# Patient Record
Sex: Female | Born: 1935 | Race: White | Hispanic: No | Marital: Married | Smoking: Never smoker
Health system: Southern US, Community
[De-identification: ages and names within clinical notes are randomized; demographics above are authoritative.]

## PROBLEM LIST (undated history)

## (undated) DIAGNOSIS — Z972 Presence of dental prosthetic device (complete) (partial): Secondary | ICD-10-CM

## (undated) DIAGNOSIS — B029 Zoster without complications: Secondary | ICD-10-CM

## (undated) DIAGNOSIS — S62109A Fracture of unspecified carpal bone, unspecified wrist, initial encounter for closed fracture: Secondary | ICD-10-CM

## (undated) DIAGNOSIS — M419 Scoliosis, unspecified: Secondary | ICD-10-CM

## (undated) DIAGNOSIS — G2581 Restless legs syndrome: Secondary | ICD-10-CM

---

## 2007-05-08 ENCOUNTER — Other Ambulatory Visit: Payer: Self-pay

## 2007-05-08 ENCOUNTER — Ambulatory Visit: Payer: Self-pay | Admitting: Internal Medicine

## 2007-05-08 ENCOUNTER — Emergency Department: Payer: Self-pay | Admitting: Emergency Medicine

## 2007-05-15 ENCOUNTER — Ambulatory Visit: Payer: Self-pay | Admitting: Family Medicine

## 2007-10-31 ENCOUNTER — Ambulatory Visit: Payer: Self-pay | Admitting: Internal Medicine

## 2011-05-03 ENCOUNTER — Ambulatory Visit: Payer: Self-pay | Admitting: Internal Medicine

## 2012-11-13 ENCOUNTER — Emergency Department: Payer: Self-pay | Admitting: Emergency Medicine

## 2013-12-20 DIAGNOSIS — H2510 Age-related nuclear cataract, unspecified eye: Secondary | ICD-10-CM | POA: Diagnosis not present

## 2015-04-25 ENCOUNTER — Ambulatory Visit
Admission: EM | Admit: 2015-04-25 | Discharge: 2015-04-25 | Disposition: A | Discharge status: Home / Self Care | Payer: Medicare Other | Attending: Family Medicine | Admitting: Family Medicine

## 2015-04-25 DIAGNOSIS — N390 Urinary tract infection, site not specified: Secondary | ICD-10-CM | POA: Diagnosis not present

## 2015-04-25 LAB — URINALYSIS COMPLETE WITH MICROSCOPIC (ARMC ONLY): PH: 0 — AB (ref 5.0–8.0)

## 2015-04-25 MED ORDER — CIPROFLOXACIN HCL 250 MG PO TABS: 250.0000 mg | ORAL_TABLET | Freq: Two times a day (BID) | ORAL | Status: DC

## 2015-04-25 NOTE — Discharge Instructions (Signed)
Take the antibiotic as prescribed.  If you fail to improve or worsen please return.  Take care  Dr. Adriana Simasook  Urinary Tract Infection Urinary tract infections (UTIs) can develop anywhere along your urinary tract. Your urinary tract is your body's drainage system for removing wastes and extra water. Your urinary tract includes two kidneys, two ureters, a bladder, and a urethra. Your kidneys are a pair of bean-shaped organs. Each kidney is about the size of your fist. They are located below your ribs, one on each side of your spine. CAUSES Infections are caused by microbes, which are microscopic organisms, including fungi, viruses, and bacteria. These organisms are so small that they can only be seen through a microscope. Bacteria are the microbes that most commonly cause UTIs. SYMPTOMS  Symptoms of UTIs may vary by age and gender of the patient and by the location of the infection. Symptoms in young women typically include a frequent and intense urge to urinate and a painful, burning feeling in the bladder or urethra during urination. Older women and men are more likely to be tired, shaky, and weak and have muscle aches and abdominal pain. A fever may mean the infection is in your kidneys. Other symptoms of a kidney infection include pain in your back or sides below the ribs, nausea, and vomiting. DIAGNOSIS To diagnose a UTI, your caregiver will ask you about your symptoms. Your caregiver will also ask you to provide a urine sample. The urine sample will be tested for bacteria and white blood cells. White blood cells are made by your body to help fight infection. TREATMENT  Typically, UTIs can be treated with medication. Because most UTIs are caused by a bacterial infection, they usually can be treated with the use of antibiotics. The choice of antibiotic and length of treatment depend on your symptoms and the type of bacteria causing your infection. HOME CARE INSTRUCTIONS  If you were prescribed  antibiotics, take them exactly as your caregiver instructs you. Finish the medication even if you feel better after you have only taken some of the medication.  Drink enough water and fluids to keep your urine clear or pale yellow.  Avoid caffeine, tea, and carbonated beverages. They tend to irritate your bladder.  Empty your bladder often. Avoid holding urine for long periods of time.  Empty your bladder before and after sexual intercourse.  After a bowel movement, women should cleanse from front to back. Use each tissue only once. SEEK MEDICAL CARE IF:   You have back pain.  You develop a fever.  Your symptoms do not begin to resolve within 3 days. SEEK IMMEDIATE MEDICAL CARE IF:   You have severe back pain or lower abdominal pain.  You develop chills.  You have nausea or vomiting.  You have continued burning or discomfort with urination. MAKE SURE YOU:   Understand these instructions.  Will watch your condition.  Will get help right away if you are not doing well or get worse.   This information is not intended to replace advice given to you by your health care provider. Make sure you discuss any questions you have with your health care provider.   Document Released: 02/16/2005 Document Revised: 01/28/2015 Document Reviewed: 06/17/2011 Elsevier Interactive Patient Education [employer]2016 Elsevier Inc.

## 2015-04-25 NOTE — ED Provider Notes (Signed)
CSN: [id number]     Arrival date & time 04/25/15  [id number] History   First MD Initiated Contact with Patient 04/25/15 1003     Chief Complaint  Patient presents with  . Abdominal Pain  . Urinary Frequency   (Consider location/radiation/quality/duration/timing/severity/associated sxs/prior Treatment) HPI  81 year old female who has been seen here previously presents to the clinic today with complaints of lower abdominal pain and burning with urination.  She reports she's had the above symptoms for the past 2 days. She reports the pain is severe. She reports associated urinary urgency and frequency. She took an over-the-counter medication with no relief. No associated fevers or chills. No reports of nausea or vomiting. No known exacerbating factors.  PMH - Hx of bells palsy  Past Surgical History  Procedure Laterality Date  . No past surgeries     Social History  Substance Use Topics  . Smoking status: Never Smoker   . Smokeless tobacco: None  . Alcohol Use: No   OB History    No data available     Review of Systems  Constitutional: Negative for fever and chills.  Gastrointestinal: Positive for abdominal pain.  Genitourinary: Positive for dysuria, urgency and frequency.  All other systems reviewed and are negative.   Allergies  Sulfa antibiotics  Home Medications   Prior to Admission medications   Medication Sig Start Date End Date Taking? Authorizing Provider  ciprofloxacin (CIPRO) 250 MG tablet Take 1 tablet (250 mg total) by mouth every 12 (twelve) hours. 04/25/15   Tommie SamsJayce G Chaniya Genter, DO   Meds Ordered and Administered this Visit  Medications - No data to display  BP 122/49 mmHg  Pulse 124  Temp(Src) 98.3 F (36.8 C) (Oral)  Resp 16  Ht 5\' 1"  (1.549 m)  Wt 120 lb (54.432 kg)  BMI 22.69 kg/m2  SpO2 97% No data found.  Physical Exam  Constitutional: She is oriented to person, place, and time. She appears well-developed. No distress.  HENT:  Head: Normocephalic and  atraumatic.  Cardiovascular: Regular rhythm.  Tachycardia present.   Pulmonary/Chest: Effort normal and breath sounds normal. She has no wheezes. She has no rales.  Abdominal: Soft.  Tentative palpation in suprapubic region. No rebound or guarding. No CVA tenderness.  Neurological: She is alert and oriented to person, place, and time.  Psychiatric: She has a normal mood and affect.  Vitals reviewed.  ED Course  Procedures (including critical care time)  Labs Review Labs Reviewed  URINALYSIS COMPLETEWITH MICROSCOPIC ([facility] ONLY) - Abnormal; Notable for the following:    Color, Urine ORANGE (*)    APPearance CLOUDY (*)    Glucose, UA   (*)    Value: TEST NOT REPORTED DUE TO COLOR INTERFERENCE OF URINE PIGMENT   Bilirubin Urine   (*)    Value: TEST NOT REPORTED DUE TO COLOR INTERFERENCE OF URINE PIGMENT   Ketones, ur   (*)    Value: TEST NOT REPORTED DUE TO COLOR INTERFERENCE OF URINE PIGMENT   Hgb urine dipstick   (*)    Value: TEST NOT REPORTED DUE TO COLOR INTERFERENCE OF URINE PIGMENT   pH 0.0 (*)    Protein, ur   (*)    Value: TEST NOT REPORTED DUE TO COLOR INTERFERENCE OF URINE PIGMENT   Nitrite   (*)    Value: TEST NOT REPORTED DUE TO COLOR INTERFERENCE OF URINE PIGMENT   Leukocytes, UA   (*)    Value: TEST NOT REPORTED DUE TO COLOR INTERFERENCE  OF URINE PIGMENT   Bacteria, UA MANY (*)    Squamous Epithelial / LPF 0-5 (*)    All other components within normal limits  URINE CULTURE    Imaging Review No results found.  MDM   1. UTI (lower urinary tract infection)    81 year old female presents with suprapubic pain and urinary complaints. History and urinalysis consistent with UTI. Interestingly, patient is tachycardic today (sinus tachycardia). No evidence of arrhythmia. She is otherwise well-appearing and the remainder of her exam is normal. No apparent acute abdomen at this time. Treating empirically for UTI with Cipro 250 MG twice a day (discussed this with  pharmacy due to the fact that I don't have a recent creatinine on the patient; this is the renal dose).  I informed the patient to follow-up if she fails to improve or worsens as I'm slightly concerned given the tachycardia. I'm hopeful that this is secondary to pain as she otherwise looks well.     Tommie Sams, DO 04/25/15 1058

## 2015-04-25 NOTE — ED Notes (Signed)
Patient complains of lower abdominal pain that is "very painful". Patient states that she is having urinary frequency and urgency. Patient stated that when she urinates it burns very badly. She states that symptoms started 2 days ago. She states she has only ever had this once before.

## 2015-04-27 LAB — URINE CULTURE: SPECIAL REQUESTS: NORMAL

## 2015-09-29 DIAGNOSIS — H2513 Age-related nuclear cataract, bilateral: Secondary | ICD-10-CM | POA: Diagnosis not present

## 2016-05-19 ENCOUNTER — Emergency Department
Admission: EM | Admit: 2016-05-19 | Discharge: 2016-05-19 | Disposition: A | Discharge status: Home / Self Care | Payer: Medicare Other | Attending: Emergency Medicine | Admitting: Emergency Medicine

## 2016-05-19 ENCOUNTER — Ambulatory Visit
Admit: 2016-05-19 | Discharge: 2016-05-19 | Disposition: A | Discharge status: Home / Self Care | Payer: Medicare Other | Attending: Emergency Medicine | Admitting: Emergency Medicine

## 2016-05-19 ENCOUNTER — Encounter: Payer: Self-pay | Admitting: Emergency Medicine

## 2016-05-19 ENCOUNTER — Ambulatory Visit (INDEPENDENT_AMBULATORY_CARE_PROVIDER_SITE_OTHER)
Admission: EM | Admit: 2016-05-19 | Discharge: 2016-05-19 | Disposition: A | Discharge status: Home / Self Care | Payer: Medicare Other | Source: Home / Self Care | Attending: Emergency Medicine | Admitting: Emergency Medicine

## 2016-05-19 DIAGNOSIS — M4856XA Collapsed vertebra, not elsewhere classified, lumbar region, initial encounter for fracture: Secondary | ICD-10-CM | POA: Diagnosis not present

## 2016-05-19 DIAGNOSIS — M48061 Spinal stenosis, lumbar region without neurogenic claudication: Secondary | ICD-10-CM

## 2016-05-19 DIAGNOSIS — M419 Scoliosis, unspecified: Secondary | ICD-10-CM

## 2016-05-19 DIAGNOSIS — M545 Low back pain: Secondary | ICD-10-CM | POA: Diagnosis not present

## 2016-05-19 DIAGNOSIS — X58XXXA Exposure to other specified factors, initial encounter: Secondary | ICD-10-CM | POA: Insufficient documentation

## 2016-05-19 DIAGNOSIS — K8020 Calculus of gallbladder without cholecystitis without obstruction: Secondary | ICD-10-CM

## 2016-05-19 DIAGNOSIS — Z8249 Family history of ischemic heart disease and other diseases of the circulatory system: Secondary | ICD-10-CM

## 2016-05-19 DIAGNOSIS — S32040A Wedge compression fracture of fourth lumbar vertebra, initial encounter for closed fracture: Secondary | ICD-10-CM

## 2016-05-19 DIAGNOSIS — M47816 Spondylosis without myelopathy or radiculopathy, lumbar region: Secondary | ICD-10-CM | POA: Diagnosis not present

## 2016-05-19 DIAGNOSIS — M5126 Other intervertebral disc displacement, lumbar region: Secondary | ICD-10-CM | POA: Insufficient documentation

## 2016-05-19 DIAGNOSIS — Y999 Unspecified external cause status: Secondary | ICD-10-CM | POA: Diagnosis not present

## 2016-05-19 DIAGNOSIS — Y939 Activity, unspecified: Secondary | ICD-10-CM | POA: Insufficient documentation

## 2016-05-19 DIAGNOSIS — Y929 Unspecified place or not applicable: Secondary | ICD-10-CM | POA: Insufficient documentation

## 2016-05-19 DIAGNOSIS — I700 Atherosclerosis of aorta: Secondary | ICD-10-CM | POA: Diagnosis not present

## 2016-05-19 DIAGNOSIS — Z888 Allergy status to other drugs, medicaments and biological substances status: Secondary | ICD-10-CM

## 2016-05-19 DIAGNOSIS — Z833 Family history of diabetes mellitus: Secondary | ICD-10-CM | POA: Insufficient documentation

## 2016-05-19 DIAGNOSIS — M5442 Lumbago with sciatica, left side: Secondary | ICD-10-CM | POA: Diagnosis not present

## 2016-05-19 DIAGNOSIS — I708 Atherosclerosis of other arteries: Secondary | ICD-10-CM

## 2016-05-19 DIAGNOSIS — N390 Urinary tract infection, site not specified: Secondary | ICD-10-CM | POA: Diagnosis not present

## 2016-05-19 DIAGNOSIS — R102 Pelvic and perineal pain: Secondary | ICD-10-CM | POA: Diagnosis not present

## 2016-05-19 LAB — BASIC METABOLIC PANEL
Anion gap: 9 (ref 5–15)
BUN: 16 mg/dL (ref 6–20)
CHLORIDE: 104 mmol/L (ref 101–111)
CO2: 22 mmol/L (ref 22–32)
Calcium: 8.8 mg/dL — ABNORMAL LOW (ref 8.9–10.3)
Creatinine, Ser: 0.52 mg/dL (ref 0.44–1.00)
GFR calc Af Amer: >60 mL/min (ref >60)
GFR calc non Af Amer: >60 mL/min (ref >60)
Glucose, Bld: 106 mg/dL — ABNORMAL HIGH (ref 65–99)
POTASSIUM: 4.1 mmol/L (ref 3.5–5.1)
SODIUM: 135 mmol/L (ref 135–145)

## 2016-05-19 LAB — URINALYSIS, ROUTINE W REFLEX MICROSCOPIC
BACTERIA UA: NONE SEEN
BILIRUBIN URINE: NEGATIVE
Glucose, UA: NEGATIVE mg/dL
HGB URINE DIPSTICK: NEGATIVE
Ketones, ur: 5 mg/dL — AB
Nitrite: NEGATIVE
PROTEIN: NEGATIVE mg/dL
SPECIFIC GRAVITY, URINE: 1.015 (ref 1.005–1.030)
pH: 6 (ref 5.0–8.0)

## 2016-05-19 MED ORDER — KETOROLAC TROMETHAMINE 60 MG/2ML IM SOLN
30.0000 mg | Freq: Once | INTRAMUSCULAR | Status: AC
  Administered 2016-05-19: 30 mg via INTRAMUSCULAR

## 2016-05-19 MED ORDER — OXYCODONE-ACETAMINOPHEN 5-325 MG PO TABS: 1.0000 | ORAL_TABLET | Freq: Four times a day (QID) | ORAL | 0 refills | Status: DC | PRN

## 2016-05-19 MED ORDER — OXYCODONE HCL 5 MG PO TABS
5.0000 mg | ORAL_TABLET | Freq: Once | ORAL | Status: AC
  Administered 2016-05-19: 5 mg via ORAL

## 2016-05-19 MED ORDER — ACETAMINOPHEN 500 MG PO TABS
1000.0000 mg | ORAL_TABLET | Freq: Once | ORAL | Status: AC
  Administered 2016-05-19: 1000 mg via ORAL

## 2016-05-19 MED ORDER — IBUPROFEN 400 MG PO TABS: 400.0000 mg | ORAL_TABLET | Freq: Once | ORAL | Status: DC

## 2016-05-19 MED ORDER — FOSFOMYCIN TROMETHAMINE 3 G PO PACK
3.0000 g | PACK | Freq: Once | ORAL | Status: AC
Start: 2016-05-19 — End: 2016-05-19
  Administered 2016-05-19: 3 g via ORAL
  Filled 2016-05-19: qty 3

## 2016-05-19 MED ORDER — ONDANSETRON HCL 4 MG/2ML IJ SOLN
4.0000 mg | Freq: Once | INTRAMUSCULAR | Status: AC
  Administered 2016-05-19: 4 mg via INTRAVENOUS

## 2016-05-19 MED ORDER — ONDANSETRON 4 MG PO TBDP: 4.0000 mg | ORAL_TABLET | Freq: Three times a day (TID) | ORAL | 0 refills | Status: DC | PRN

## 2016-05-19 NOTE — Discharge Instructions (Signed)
Go immediately to the [facility] ED. We need to get your pain under adequate control so that he can take care of your self at home.

## 2016-05-19 NOTE — ED Notes (Signed)
Pt c/o severe back pain that started 12/25 - pt denies injury or fall - pt went to urgent care and was told she had compression fractures and they sent her here for pain medication and ortho referral

## 2016-05-19 NOTE — ED Triage Notes (Signed)
Pt to ed with c/o back pain.  [facility].  See CT results in chart. Pt states she is unable to bear weight.

## 2016-05-19 NOTE — ED Provider Notes (Signed)
HPI  SUBJECTIVE:  Kirsten Reed is a 82 y.o. female who presents with constant, midline, low back pain described as severe and sore for the past 3 days. She states it radiates down both her legs, states that she is unable to bear weight on her left leg secondary to the pain. She tried OTC Aleve, Naprosyn 1 tab twice a day to 3 times a day without improvement in her symptoms. Symptoms are worse with sitting, rolling over in bed, walking. reports nausea after taking the Aleve. denies vomiting, fevers, flank pain, abdominal pain, urinary urgency, frequency, dysuria, cloudy or odorous urine, hematuria.  No syncope. No saddle anesthesia, distal weakness/numbness, recent h/o trauma to her back or her hips, no recent fall, no recent heavy lifting. No  bladder/ bowel incontinence, urinary retention, h/o CA / multiple myleoma,  pain worse at night,  h/o prolonged steroid use, h/o osteopenia, h/o HIV,known AAA.  She has a past history of UTIs, and "backaches". no h/o pyelonephritis, nephrolithiasis.  PMD: none    History reviewed. No pertinent past medical history.  Past Surgical History:  Procedure Laterality Date  . NO PAST SURGERIES      Family History  Problem Relation Age of Onset  . Hypertension Mother   . Diabetes Mother     Social History  Substance Use Topics  . Smoking status: Never Smoker  . Smokeless tobacco: Never Used  . Alcohol use No    No current facility-administered medications for this encounter.  No current outpatient prescriptions on file.  Allergies  Allergen Reactions  . Sulfa Antibiotics      ROS  As noted in HPI.   Physical Exam  BP (!) 167/62 (BP Location: Left Arm)   Pulse 91   Temp 97.9 F (36.6 C) (Oral)   Resp 17   Ht 5\' 1"  (1.549 m)   Wt 120 lb (54.4 kg)   SpO2 97%   BMI 22.67 kg/m   Constitutional: Well developed, well nourished, no acute distress. Patient's seems to have a lot of pain when transferring from chair to bed, she is unable to  bear weight on her legs. She unable to lie flat. Eyes:  EOMI, conjunctiva normal bilaterally HENT: Normocephalic, atraumatic,mucus membranes moist Respiratory: Normal inspiratory effort Cardiovascular: Normal rate GI: nondistended. No suprapubic, flank tenderness. no palpable abdominal mass. skin: No rash, skin intact Musculoskeletal: no CVAT. - paralumbar tenderness,  - muscle spasm. + midline  bony tenderness L5-S1, SIJ. Bilateral lower extremities nontender, baseline ROM with intact DP pulses, No pain with int/ext rotation flex/extension hips bilaterally. SLR neg bilaterally. Sensation baseline light touch bilaterally for Pt, knee jerks/ankle jerk reflexes 1+ bilaterally, but equal. Motor symmetric bilateral 5/5 hip flexion, quadriceps, hamstrings, EHL, foot dorsiflexion, foot plantarflexion. Pain aggravated with active hip flexion against resistance bilaterally. Pelvis stable. No tenderness along the greater trochanter. Neurologic: Alert & oriented x 3, no focal neuro deficits Psychiatric: Speech and behavior appropriate   ED Course   Medications  acetaminophen (TYLENOL) tablet 1,000 mg (1,000 mg Oral Given 05/19/16 0917)  ketorolac (TORADOL) injection 30 mg (30 mg Intramuscular Given 05/19/16 1047)    Orders Placed This Encounter  Procedures  . CT Lumbar Spine Wo Contrast    Standing Status:   Standing    Number of Occurrences:   1    Order Specific Question:   Reason for Exam (SYMPTOM  OR DIAGNOSIS REQUIRED)    Answer:   severe LBP r/o acute changes  . CT Pelvis Wo Contrast  Standing Status:   Standing    Number of Occurrences:   1    Order Specific Question:   Reason for Exam (SYMPTOM  OR DIAGNOSIS REQUIRED)    Answer:   severe LBP r/o acute changes  . Urinalysis, Complete w Microscopic    Standing Status:   Standing    Number of Occurrences:   1  . Basic metabolic panel    Standing Status:   Standing    Number of Occurrences:   1    Results for orders placed or  performed during the hospital encounter of 05/19/16 (from the past 24 hour(s))  Basic metabolic panel     Status: Abnormal   Collection Time: 05/19/16  9:15 AM  Result Value Ref Range   Sodium 135 135 - 145 mmol/L   Potassium 4.1 3.5 - 5.1 mmol/L   Chloride 104 101 - 111 mmol/L   CO2 22 22 - 32 mmol/L   Glucose, Bld 106 (H) 65 - 99 mg/dL   BUN 16 6 - 20 mg/dL   Creatinine, Ser 7.820.52 0.44 - 1.00 mg/dL   Calcium 8.8 (L) 8.9 - 10.3 mg/dL   GFR calc non Af Amer >60 >60 mL/min   GFR calc Af Amer >60 >60 mL/min   Anion gap 9 5 - 15   Ct Lumbar Spine Wo Contrast  Result Date: 05/19/2016 CLINICAL DATA:  Low back pain radiating into the thighs, left greater than right. Symptoms for 5 days. Limited weight-bearing. No known injury. EXAM: CT LUMBAR SPINE WITHOUT CONTRAST TECHNIQUE: Multidetector CT imaging of the lumbar spine was performed without intravenous contrast administration. Multiplanar CT image reconstructions were also generated. COMPARISON:  Renal ultrasound 10/31/2007 FINDINGS: Segmentation: There are 5 lumbar type vertebral bodies. Alignment: There is a convex left scoliosis centered at L3. The lateral alignment is normal. Vertebrae: There is a less than 20% inferior endplate compression deformity at L4. This has sharp linear components (best seen on the axial and coronal images) and is likely acute. There is a mild superior endplate compression deformity at L1 which is probably chronic. No worrisome osseous findings. No evidence of pars defect. Paraspinal and other soft tissues: No paraspinal hematoma identified. There is aortic and branch vessel atherosclerosis. There is a cystic lesion within the left hepatic lobe and multiple calcified gallstones. There are low-density right renal lesions, likely cysts. No hydronephrosis. Disc levels: No significant disc space findings from T10-11 through L2-3. L3-4: Loss of disc height with annular disc bulging and endplate osteophytes asymmetric to the  right. Moderate facet and ligamentous hypertrophy. These factors contribute to moderate to severe spinal stenosis. There is mild asymmetric narrowing of the right lateral recess. The foramina appear sufficiently patent. L4-5: As above, suspected acute inferior endplate compression deformity at L4 with minimal osseous retropulsion. There is annular disc bulging eccentric to the left and moderate facet and ligamentous hypertrophy. There is resulting moderate to severe spinal stenosis. In addition, there is asymmetric narrowing of the left lateral recess and possible left L5 nerve root encroachment. Mild inferior foraminal narrowing is present on the right. L5-S1: Disc degeneration with annular bulging and endplate osteophytes asymmetric to the left. Asymmetric left-sided facet hypertrophy. No significant spinal stenosis or nerve root encroachment. IMPRESSION: 1. Mild inferior endplate compression deformity at L4, likely acute. No significant paraspinal hematoma. 2. Chronic appearing superior endplate compression deformity at L1. 3. Convex left scoliosis with multilevel spondylosis. Resulting moderate to severe multifactorial spinal stenosis at L3-4 and L4-5. See details above.  4. Additional incidental findings including cholelithiasis and atherosclerosis. Electronically Signed   By: Carey Bullocks M.D.   On: 05/19/2016 10:48   Ct Pelvis Wo Contrast  Result Date: 05/19/2016 CLINICAL DATA:  Severe low back pain radiating into anterior portions of legs. Symptoms beginning 5 days ago. No known injury. EXAM: CT PELVIS WITHOUT CONTRAST TECHNIQUE: Multidetector CT imaging of the pelvis was performed following the standard protocol without intravenous contrast. COMPARISON:  None FINDINGS: Urinary Tract: Visualized mid and distal ureters are decompressed. Urinary bladder unremarkable. Bowel: Visualized large and small bowel decompressed, grossly unremarkable. Vascular/Lymphatic: Calcifications in the distal aorta and  iliac vessels. No aneurysm. Reproductive:  Uterus and adnexa unremarkable.  No mass. Other:  No free fluid or free air. Musculoskeletal: Degenerative disc disease in the visualized lower lumbar spine. SI joints and hip joints are symmetric and unremarkable. No acute bony abnormality. IMPRESSION: No acute or significant pelvic abnormality. No acute bony abnormality. Degenerative changes in the visualized lower lumbar spine. Aortoiliac atherosclerosis. Electronically Signed   By: Charlett Nose M.D.   On: 05/19/2016 10:34    ED Clinical Impression  Closed compression fracture of fourth lumbar vertebra, initial encounter [facility])   ED Assessment/Plan  [place] narcotic database reviewed. Pt with no narcotic rx in the past 6 months.   Checking kidney function, UA, getting CT of L-spine and pelvis due to unexplained pain. She appears to be neurovascularly intact. Giving Tylenol 1 g. Will consider NSAIDs based on kidney function.  BMP normal, with normal kidney function. Including 30 mg Toradol IM.  Reviewed imaging independently and discussed with rads. Scoliosis centered at L3.   less than 20% inferior endplate compression deformity of L4, likely acute, with minimal osseous retropulsion. No paraspinal hematoma. Chronic appearing superior endplate compression deformity at L1. Moderate to severe spinal stenosis at L3-L5. No abdominal aneurysm.   Pelvis CT: No acute or significant pelvic abnormality. No aneurysm of the distal aorta.   Reevaluation, patient states that she feels a little better, but she is still unable to ambulate due to pain. Sending to the ED for pain control given that I do not feel that she would be able to adequately take care of herself at home with her elderly husband at this point in time. Also concern for atraumatic compression fracture- in the differential is malignancy. Patient is stable to go via private vehicle. She declined to go via EMS. Notified [place] ED.  Discussed imaging,  MDM, plan and followup with patient.  Patient agrees with plan.   *This clinic note was created using Dragon dictation software. Therefore, there may be occasional mistakes despite careful proofreading.  ?    Domenick Gong, MD 05/19/16 [phone]

## 2016-05-19 NOTE — ED Triage Notes (Signed)
Patient scheduled for CT lumbar spine and CT pelvis w/o contrast. No prior authorization required for outpatient Per Marcelino DusterMichelle T. [employer] [phone]

## 2016-05-19 NOTE — ED Triage Notes (Signed)
Patient complains of low back pain with radiation. Patient states that pain radiates down both legs, left worse than right. Patient reports that symptom started 5 days ago. Patient complains that pain is excruciating. Patient states that pain hurts all the time. Patient states that she has never had pain like this in her life.

## 2016-05-19 NOTE — ED Provider Notes (Addendum)
[facility] Emergency Department Provider Note  Time seen: 2:55 PM  I have reviewed the triage vital signs and the nursing notes.   HISTORY  Chief Complaint Back Pain    HPI Kirsten Reed is a 82 y.o. female who presents to the emergency department from urgent care for back pain. According to the patient she has been experiencing lower back pain for the past one week, states it was worse today so she went to urgent care. She had a CT scan showing an endplate compression fracture and was transferred to the emergency department for further evaluation. Patient denies any bladder or bowel incontinence, denies any leg weakness or numbness but does state pain in the front of her legs especially down her left leg when ambulating. Patient was given Tylenol for pain control the urgent care and sent to the ER for evaluation. Patient states very minimal back pain while lying still in bed but states moderate to severe back pain when she attempts to stand.  History reviewed. No pertinent past medical history.  There are no active problems to display for this patient.   Past Surgical History:  Procedure Laterality Date  . NO PAST SURGERIES      Prior to Admission medications   Not on File    Allergies  Allergen Reactions  . Sulfa Antibiotics     Family History  Problem Relation Age of Onset  . Hypertension Mother   . Diabetes Mother     Social History Social History  Substance Use Topics  . Smoking status: Never Smoker  . Smokeless tobacco: Never Used  . Alcohol use No    Review of Systems Constitutional: Negative for fever. Cardiovascular: Negative for chest pain. Respiratory: Negative for shortness of breath. Gastrointestinal: Negative for abdominal pain Musculoskeletal: Positive for lower back pain. Skin: Negative for rash. Neurological: Negative for headache 10-point ROS otherwise  negative.  ____________________________________________   PHYSICAL EXAM:  VITAL SIGNS: ED Triage Vitals  Enc Vitals Group     BP 05/19/16 1305 (!) 165/68     Pulse Rate 05/19/16 1305 87     Resp 05/19/16 1305 20     Temp 05/19/16 1309 97.5 F (36.4 C)     Temp Source 05/19/16 1305 Oral     SpO2 05/19/16 1305 100 %     Weight 05/19/16 1306 120 lb (54.4 kg)     Height --      Head Circumference --      Peak Flow --      Pain Score 05/19/16 1306 10     Pain Loc --      Pain Edu? --      Excl. in GC? --     Constitutional: Alert and oriented. Well appearing and in no distress. Eyes: Normal exam ENT   Head: Normocephalic and atraumatic.   Mouth/Throat: Mucous membranes are moist. Cardiovascular: Normal rate, regular rhythm. No murmur Respiratory: Normal respiratory effort without tachypnea nor retractions. Breath sounds are clear  Gastrointestinal: Soft and nontender. No distention.  Musculoskeletal: Nontender with normal range of motion in all extremities Neurologic:  Normal speech and language. No gross focal neurologic deficits. Good strength in bilateral lower extremities. Skin:  Skin is warm, dry and intact.  Psychiatric: Mood and affect are normal.   ____________________________________________    INITIAL IMPRESSION / ASSESSMENT AND PLAN / ED COURSE  Pertinent labs & imaging results that were available during my care of the patient were reviewed by me and considered  in my medical decision making (see chart for details).  Patient presents the emergency department with lower back pain with occasional pain radiating down her left leg this has been ongoing for approximately one week. Patient CT scan shows an endplate compression fracture which appears to be acute. Patient denies any pain when resting, but states significant pain when ambulating. We will treat the patient's pain with oxycodone and attempt ambulation with a 4. walker in the emergency department. If  the patient is able ambulate I anticipate discharge home.  We will also check a urinalysis as the patient recent suffered a urinary tract infection.  Urinary tract infection we will send a urine culture and dose fosfomycin. Patient able to walk in a hunched over position. I discussed admission to the hospital or social worker for placement to a rehabilitation facility, patient is adamantly against this and strongly wished to go home. She states she lives with her husband and multiple family members live within a mild of her who will help her. I discussed if it becomes too difficult for her to walk or she is at risk of falling she needs to come back to the ER for evaluation. Patient is agreeable.  ____________________________________________   FINAL CLINICAL IMPRESSION(S) / ED DIAGNOSES  Compression fracture    Minna AntisKevin Leeland Lovelady, MD 05/19/16 1459    Minna AntisKevin Yousef Huge, MD 05/19/16 [phone]

## 2016-05-19 NOTE — ED Notes (Signed)
Pt is unable to stand erect, unable to bear total weight on legs, unable to take a step unassisted, and unable to pick up walker to be able to ambulate - Dr Lenard LancePaduchowski notified

## 2016-05-19 NOTE — ED Notes (Signed)
Pt escorted to toilet to obtain urine sample

## 2016-05-20 LAB — URINE CULTURE: Culture: NO GROWTH

## 2016-05-21 ENCOUNTER — Emergency Department
Admission: EM | Admit: 2016-05-21 | Discharge: 2016-05-21 | Disposition: A | Discharge status: Home / Self Care | Payer: Medicare Other | Attending: Emergency Medicine | Admitting: Emergency Medicine

## 2016-05-21 DIAGNOSIS — S3992XS Unspecified injury of lower back, sequela: Secondary | ICD-10-CM | POA: Diagnosis present

## 2016-05-21 DIAGNOSIS — X58XXXS Exposure to other specified factors, sequela: Secondary | ICD-10-CM | POA: Insufficient documentation

## 2016-05-21 DIAGNOSIS — S32049S Unspecified fracture of fourth lumbar vertebra, sequela: Secondary | ICD-10-CM | POA: Insufficient documentation

## 2016-05-21 DIAGNOSIS — M5441 Lumbago with sciatica, right side: Secondary | ICD-10-CM

## 2016-05-21 DIAGNOSIS — M545 Low back pain: Secondary | ICD-10-CM | POA: Diagnosis not present

## 2016-05-21 DIAGNOSIS — R262 Difficulty in walking, not elsewhere classified: Secondary | ICD-10-CM

## 2016-05-21 LAB — BASIC METABOLIC PANEL
ANION GAP: 11 (ref 5–15)
BUN: 14 mg/dL (ref 6–20)
CALCIUM: 8.4 mg/dL — AB (ref 8.9–10.3)
CHLORIDE: 104 mmol/L (ref 101–111)
CO2: 21 mmol/L — AB (ref 22–32)
CREATININE: 0.5 mg/dL (ref 0.44–1.00)
GFR calc Af Amer: >60 mL/min (ref >60)
GFR calc non Af Amer: >60 mL/min (ref >60)
GLUCOSE: 88 mg/dL (ref 65–99)
Potassium: 3.9 mmol/L (ref 3.5–5.1)
Sodium: 136 mmol/L (ref 135–145)

## 2016-05-21 LAB — CBC WITH DIFFERENTIAL/PLATELET
BASOS PCT: 1 %
Basophils Absolute: 0.1 10*3/uL (ref 0–0.1)
Eosinophils Absolute: 0.1 10*3/uL (ref 0–0.7)
Eosinophils Relative: 1 %
HEMATOCRIT: 26.3 % — AB (ref 35.0–47.0)
HEMOGLOBIN: 8.3 g/dL — AB (ref 12.0–16.0)
LYMPHS ABS: 1.2 10*3/uL (ref 1.0–3.6)
Lymphocytes Relative: 14 %
MCH: 22.5 pg — AB (ref 26.0–34.0)
MCHC: 31.6 g/dL — AB (ref 32.0–36.0)
MCV: 71.2 fL — ABNORMAL LOW (ref 80.0–100.0)
MONO ABS: 0.7 10*3/uL (ref 0.2–0.9)
MONOS PCT: 9 %
NEUTROS ABS: 6.4 10*3/uL (ref 1.4–6.5)
NEUTROS PCT: 75 %
Platelets: 393 10*3/uL (ref 150–440)
RBC: 3.69 MIL/uL — ABNORMAL LOW (ref 3.80–5.20)
RDW: 18.5 % — AB (ref 11.5–14.5)
WBC: 8.7 10*3/uL (ref 3.6–11.0)

## 2016-05-21 MED ORDER — HYDROCODONE-ACETAMINOPHEN 5-325 MG PO TABS: 2.0000 | ORAL_TABLET | ORAL | 0 refills | Status: DC | PRN

## 2016-05-21 MED ORDER — IBUPROFEN 600 MG PO TABS
600.0000 mg | ORAL_TABLET | Freq: Once | ORAL | Status: AC
  Administered 2016-05-21: 600 mg via ORAL
  Filled 2016-05-21: qty 1

## 2016-05-21 MED ORDER — FENTANYL CITRATE (PF) 100 MCG/2ML IJ SOLN
75.0000 ug | Freq: Once | INTRAMUSCULAR | Status: AC
  Administered 2016-05-21: 75 ug via INTRAVENOUS
  Filled 2016-05-21: qty 2

## 2016-05-21 MED ORDER — HYDROCODONE-ACETAMINOPHEN 5-325 MG PO TABS
2.0000 | ORAL_TABLET | Freq: Once | ORAL | Status: AC
  Administered 2016-05-21: 2 via ORAL
  Filled 2016-05-21: qty 2

## 2016-05-21 MED ORDER — IBUPROFEN 600 MG PO TABS: 600.0000 mg | ORAL_TABLET | Freq: Three times a day (TID) | ORAL | 0 refills | Status: DC | PRN

## 2016-05-21 NOTE — ED Notes (Signed)
Pt states some relief of pain in back. Pt able to sit up some in the bed. States she needs more time because the pain is still there nurse to reassess.

## 2016-05-21 NOTE — NC FL2 (Signed)
  [place] MEDICAID FL2 LEVEL OF CARE SCREENING TOOL     IDENTIFICATION  Patient Name: Kirsten RowanCarol Reed Birthdate: 10/13/1930 Sex: female Admission Date (Current Location): 05/21/2016  [place]   Facility and Address:  [facility], [address], [place], [place] [address]      Provider Number: [id number]  Attending Physician Name and Address:  Governor Rooksebecca Lord, MD  Relative Name and Phone Number:       Current Level of Care: Hospital Recommended Level of Care: Skilled Nursing Facility Prior Approval Number:    Date Approved/Denied:   PASRR Number:   [id number]   Discharge Plan: SNF    Current Diagnoses: There are no active problems to display for this patient.   Orientation RESPIRATION BLADDER Height & Weight     Self, Time, Situation, Place  Normal Continent Weight: 120 lb (54.4 kg) Height:  5\' 1"  (154.9 cm)  BEHAVIORAL SYMPTOMS/MOOD NEUROLOGICAL BOWEL NUTRITION STATUS      Continent Diet (Normal)  AMBULATORY STATUS COMMUNICATION OF NEEDS Skin   Extensive Assist Verbally Normal                       Personal Care Assistance Level of Assistance  Bathing, Feeding, Dressing, Total care Bathing Assistance: Limited assistance Feeding assistance: Limited assistance Dressing Assistance: Limited assistance Total Care Assistance: Limited assistance   Functional Limitations Info  Sight, Hearing, Speech Sight Info: Adequate Hearing Info: Adequate Speech Info: Adequate    SPECIAL CARE FACTORS FREQUENCY  PT (By licensed PT), OT (By licensed OT)     PT Frequency: x5 OT Frequency: x5            Contractures      Additional Factors Info  Allergies   Allergies Info: Sulpha antibiotics           Current Medications (05/21/2016):  This is the current hospital active medication list No current facility-administered medications for this encounter.    Current Outpatient Prescriptions  Medication  Sig Dispense Refill  . HYDROcodone-acetaminophen (NORCO/VICODIN) 5-325 MG tablet Take 2 tablets by mouth every 4 (four) hours as needed for moderate pain or severe pain. 10 tablet 0  . ibuprofen (ADVIL,MOTRIN) 600 MG tablet Take 1 tablet (600 mg total) by mouth every 8 (eight) hours as needed. 20 tablet 0  . ondansetron (ZOFRAN ODT) 4 MG disintegrating tablet Take 1 tablet (4 mg total) by mouth every 8 (eight) hours as needed for nausea or vomiting. 20 tablet 0  . oxyCODONE-acetaminophen (ROXICET) 5-325 MG tablet Take 1 tablet by mouth every 6 (six) hours as needed. 20 tablet 0     Discharge Medications: Please see discharge summary for a list of discharge medications.  Relevant Imaging Results:  Relevant Lab Results:   Additional Information SSN   [name], [place] Reed, [place]

## 2016-05-21 NOTE — ED Notes (Signed)
Pt with Physical therapy at this time. Family at bedside.

## 2016-05-21 NOTE — ED Triage Notes (Addendum)
BIB EMS from home c/o of worsening back pain that radiates to bilateral thighs and knees.  was seen on 12/28 denies injury. States oxycodone pain medication is not helping pain. States she is unable to walk due to pain.

## 2016-05-21 NOTE — ED Provider Notes (Addendum)
[facility] Emergency Department Provider Note ____________________________________________   I have reviewed the triage vital signs and the triage nursing note.  HISTORY  Chief Complaint Back Pain   Historian Patient and son  HPI Kirsten Reed is a 82 y.o. female who lives at home and helps take care of her husband, son also presents today with her. She was diagnosed with lumbar endplate fracture2 days ago by CT and was discharged home with oxycodone 5 mg every 6 hours. She states that she took that for 2 days and it did help a little bit, but she does not feel like she is getting adequate pain relief and when she woke up this morning she could barely get out of bed. She feels like she can't walk to the bathroom. She's had no new weakness or numbness. The pain is severe. No incontinence.  Family reports they discussed nursing home versus go home at last visit and the patient really wanted to go home. Right now she is asking for a "shot to give me pain relief."    No past medical history on file.  There are no active problems to display for this patient.   Past Surgical History:  Procedure Laterality Date  . NO PAST SURGERIES      Prior to Admission medications   Medication Sig Start Date End Date Taking? Authorizing Provider  ondansetron (ZOFRAN ODT) 4 MG disintegrating tablet Take 1 tablet (4 mg total) by mouth every 8 (eight) hours as needed for nausea or vomiting. 05/19/16   Minna AntisKevin Paduchowski, MD  oxyCODONE-acetaminophen (ROXICET) 5-325 MG tablet Take 1 tablet by mouth every 6 (six) hours as needed. 05/19/16   Minna AntisKevin Paduchowski, MD    Allergies  Allergen Reactions  . Sulfa Antibiotics     Family History  Problem Relation Age of Onset  . Hypertension Mother   . Diabetes Mother     Social History Social History  Substance Use Topics  . Smoking status: Never Smoker  . Smokeless tobacco: Never Used  . Alcohol use No    Review of  Systems  Constitutional: Negative for fever. Eyes: Negative for visual changes. ENT: Negative for sore throat. Cardiovascular: Negative for chest pain. Respiratory: Negative for shortness of breath. Gastrointestinal: Negative for abdominal pain, vomiting and diarrhea. Genitourinary: Negative for dysuria. Musculoskeletal: Positive for low back pain. Skin: Negative for rash. Neurological: Negative for headache. 10 point Review of Systems otherwise negative ____________________________________________   PHYSICAL EXAM:  VITAL SIGNS: ED Triage Vitals  Enc Vitals Group     BP 05/21/16 0836 (!) 178/77     Pulse Rate 05/21/16 0836 (!) 105     Resp 05/21/16 0836 16     Temp 05/21/16 0836 98.2 F (36.8 C)     Temp Source 05/21/16 0836 Oral     SpO2 05/21/16 0836 100 %     Weight 05/21/16 0836 120 lb (54.4 kg)     Height 05/21/16 0836 5\' 1"  (1.549 m)     Head Circumference --      Peak Flow --      Pain Score 05/21/16 0837 10     Pain Loc --      Pain Edu? --      Excl. in GC? --      Constitutional: Alert and oriented. Well appearing and in no distress. HEENT   Head: Normocephalic and atraumatic.      Eyes: Conjunctivae are normal. PERRL. Normal extraocular movements.  Ears:         Nose: No congestion/rhinnorhea.   Mouth/Throat: Mucous membranes are moist.   Neck: No stridor. Cardiovascular/Chest: Normal rate, regular rhythm.  No murmurs, rubs, or gallops. Respiratory: Normal respiratory effort without tachypnea nor retractions. Breath sounds are clear and equal bilaterally. No wheezes/rales/rhonchi. Gastrointestinal: Soft. No distention, no guarding, no rebound. Nontender.    Genitourinary/rectal:Deferred Musculoskeletal: Midline low back pain without step-off. Nontender with normal range of motion in all extremities. No joint effusions.  No lower extremity tenderness.  No edema. Neurologic:  Normal speech and language. No gross or focal neurologic deficits  are appreciated. Skin:  Skin is warm, dry and intact. No rash noted. Psychiatric: Mood and affect are normal. Speech and behavior are normal. Patient exhibits appropriate insight and judgment.   ____________________________________________  LABS (pertinent positives/negatives)  Labs Reviewed  BASIC METABOLIC PANEL - Abnormal; Notable for the following:       Result Value   CO2 21 (*)    Calcium 8.4 (*)    All other components within normal limits  CBC WITH DIFFERENTIAL/PLATELET - Abnormal; Notable for the following:    RBC 3.69 (*)    Hemoglobin 8.3 (*)    HCT 26.3 (*)    MCV 71.2 (*)    MCH 22.5 (*)    MCHC 31.6 (*)    RDW 18.5 (*)    All other components within normal limits  URINALYSIS, COMPLETE (UACMP) WITH MICROSCOPIC    ____________________________________________    EKG I, Governor Rooks, MD, the attending physician have personally viewed and interpreted all ECGs.  None ____________________________________________  RADIOLOGY All Xrays were viewed by me. Imaging interpreted by Radiologist.  None __________________________________________  PROCEDURES  Procedure(s) performed: None  Critical Care performed: None  ____________________________________________   ED COURSE / ASSESSMENT AND PLAN  Pertinent labs & imaging results that were available during my care of the patient were reviewed by me and considered in my medical decision making (see chart for details).   [name] is here for uncontrolled pain related to recently diagnosed lumbar fracture. She's not been taking any anti-inflammatory. She's been taking one 5 mg hydrocodone every 6 hours for the past 2 days and reports minimal relief and states she cannot go on like this.   She is requesting something as a "shot."  Ultimately, definitely she is expressing any neurologic symptoms may be concerned about a worsening complication of the recently diagnosed fracture.  She is not reporting abdominal  pain or urinary symptoms. I suspect her pain is ongoing from the acute fracture.  She is on a relatively low dose of hydrocodone, and I discussed with her that after regained control of her pain, perhaps increasing the dose and frequency may be able to give her relief such that she could stay at home rather than alternative which would be social work evaluation for possible nursing home placement.   Patient was able to barely hobble, requiring an additional personnel to get to the bathroom after IV fentanyl.  Patient states that she would like to be evaluated to get her nursing home. I did place a consult with social work and physical therapy.  Patient care transferred to Dr. Fanny Bien at shift change 3pm.  Awaiting SW and PT consults.    CONSULTATIONS:   Physical therapy and social work.   Patient / Family / Caregiver informed of clinical course, medical decision-making process, and agree with plan.  Addended to include, if patient discharges home which may be her  preference, I had recommended to patient and family to increase as needed and as tolerated to 1-2 tablets of the 5 mg hydrocodone/acetaminophen 325 mg every 4 hours and to also add ibuprofen for some pain relief as well.   Addended again at 4 PM, I spoke with both the patient and her husband and her grandson, all family members would like to take her home despite the trouble that she has walking. She really wants to follow-up with her outpatient orthopedic doctor. I will go ahead and discharge home. ___________________________________________   FINAL CLINICAL IMPRESSION(S) / ED DIAGNOSES   Final diagnoses:  Closed fracture of fourth lumbar vertebra, unspecified fracture morphology, sequela              Note: This dictation was prepared with Dragon dictation. Any transcriptional errors that result from this process are unintentional    Governor Rooksebecca Tomy Khim, MD 05/21/16 1503    Governor Rooksebecca Laprecious Austill, MD 05/21/16 1519     Governor Rooksebecca Amilcar Reever, MD 05/21/16 1600

## 2016-05-21 NOTE — Evaluation (Signed)
Physical Therapy Evaluation Patient Details Name: Kirsten Reed MRN: [id number] DOB: [birth year] Today's Date: 05/21/2016   History of Present Illness  presented to ER secondary to uncontrolled back pain secondary to acute compression fracture (noted during previous ER visit on 12/28).  Patient reports worsening pain since discharge a few days prior, unable to manage/ambulate in home environment.  Denies additional injury, reports "the only reason I came here is for a shot or something to make this pain better".  Imaging significant for acute L4 endplate compression fracture, chronic L1 endplate compression fracture and moderate/severe spinal stenosis L3-4, L4-5.  Clinical Impression  Upon evaluation, patient alert and oriented; follows simple commands, but requires extensive encouragement to participate with strength/mobility assessment.  Reports 9-10/10 pain with any/all attempts at movement, noting shooting/radiating pain to R thigh/LE (denies paresthesia otherwise).  Currently requiring mod assist for rolling, dep assist for scooting up in bed, close sup for static sitting balance.  Once upright in short-sitting, reports inability to tolerate pain in back/R LE and throws self back on bed (with mid-thigh distally hanging off end of bed). Patient generally disinterested in therapist training/education, repeatedly stating she is only here to get something to "help with the pain".  Strongly recommend transition to STR upon discharge given in ability to tolerate bed mobility, much less transfers/ambulation/stairs required for return home.  Patient adamantly refusing to even consider transfer to rehab; unable to redirect/reason with at this time. Given significant pain and associated mobility limitations, may benefit from in-house orthopedic vs neurosurgical consult to evaluate options for patient.  Discussed above with CSW and RN. Would benefit from skilled PT to address above deficits and promote optimal  return to PLOF; recommend transition to STR upon discharge from acute hospitalization.  Of note, patient insistent upon return home, in which case she would benefit from manual WC with cushion, RW and BSC; CSW informed/aware.    Follow Up Recommendations SNF    Equipment Recommendations  Rolling walker with 5" wheels;Wheelchair cushion (measurements PT);Wheelchair (measurements PT);3in1 (PT)    Recommendations for Other Services  (may benefit from ortho consult (kypho?) vs neurosurgery consult)     Precautions / Restrictions Precautions Precautions: Back;Fall Restrictions Weight Bearing Restrictions: No      Mobility  Bed Mobility Overal bed mobility: Needs Assistance Bed Mobility: Rolling;Supine to Sit;Sit to Supine Rolling: Mod assist   Supine to sit: Min assist Sit to supine: Supervision   General bed mobility comments: patient somewhat argumentative with therapist at times, limited reception to education/cuing for log rolling technique.  Insistent that she "has to do it my way".  Once upright in short-sitting, reports inability to tolerate pain in back/R LE and throws self back on bed (with mid-thigh distally hanging off end of bed).  Dep for repositioning in bed.  Transfers                 General transfer comment: unsafe/unable/refused  Ambulation/Gait             General Gait Details: unsafe/unable/refused  Stairs            Wheelchair Mobility    Modified Rankin (Stroke Patients Only)       Balance Overall balance assessment: Needs assistance Sitting-balance support: No upper extremity supported;Feet supported Sitting balance-Leahy Scale: Fair         Standing balance comment: unsafe/unable/refused  Pertinent Vitals/Pain Pain Assessment: Faces Pain Score: 10-Worst pain ever Faces Pain Scale: Hurts worst Pain Location: back Pain Descriptors / Indicators: Burning;Shooting;Sharp Pain  Intervention(s): Limited activity within patient's tolerance;Monitored during session;Repositioned    Home Living Family/patient expects to be discharged to:: Private residence Living Arrangements: Spouse/significant other Available Help at Discharge: Family;Available PRN/intermittently Type of Home: House Home Access: Stairs to enter Entrance Stairs-Rails: Right;Left Entrance Stairs-Number of Steps: 3 Home Layout: Laundry or work area in basement;Two level;Able to live on main level with bedroom/bathroom Home Equipment: None      Prior Function Level of Independence: Independent         Comments: At baseline, indep with ADLs, household activities; 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  Progressive decline in functional ability in previous week due to worsening back pain.     Hand Dominance        Extremity/Trunk Assessment   Upper Extremity Assessment Upper Extremity Assessment: Overall WFL for tasks assessed    Lower Extremity Assessment Lower Extremity Assessment: Generalized weakness (grossly at least 3-/5 throughout, refused MMT otherwise.  Denies numbness/paresthesia; reports shooting pain in R thigh/LE with movement attempts.)    Cervical / Trunk Assessment Cervical / Trunk Assessment: Kyphotic  Communication   Communication: No difficulties  Cognition Arousal/Alertness: Awake/alert Behavior During Therapy: Restless;Agitated;Impulsive (Unable to redirect/reason with patient) Overall Cognitive Status: Within Functional Limits for tasks assessed                 General Comments: question some degree of memory loss/cognitive impairment, frequently re-telling therapist similar stories multiple times during evaluation    General Comments      Exercises Other Exercises Other Exercises: Attempted education in log rolling, back precautions-intermittent voiced understanding, but demonstrates no functional awareness/adherence during remaining portions of  session. Other Exercises: Extensive time/education on physical limitations and need for assist at this time, role of rehab and need for transition to STR.  Patient adamantly refusing, unable to reason with at this time.  Concerned primarily for her husband-does not want to leave him to go to rehab, and will not agree to any rehab/alternative plans until she sees physician for follow up on 1/4.   Assessment/Plan    PT Assessment Patient needs continued PT services  PT Problem List Decreased strength;Decreased range of motion;Decreased activity tolerance;Decreased balance;Decreased mobility;Decreased coordination;Decreased knowledge of use of DME;Decreased cognition;Decreased safety awareness;Decreased knowledge of precautions;Pain          PT Treatment Interventions DME instruction;Stair training;Gait training;Functional mobility training;Therapeutic activities;Therapeutic exercise;Balance training;Neuromuscular re-education;Cognitive remediation;Patient/family education    PT Goals (Current goals can be found in the Care Plan section)  Acute Rehab PT Goals Patient Stated Goal: to go home PT Goal Formulation: With patient Time For Goal Achievement: 06/04/16 Potential to Achieve Goals: Fair    Frequency Min 2X/week   Barriers to discharge Decreased caregiver support;Inaccessible home environment      Co-evaluation               End of Session   Activity Tolerance: Patient limited by pain Patient left: in bed;with call bell/phone within reach;with family/visitor present Nurse Communication: Mobility status    Functional Assessment Tool Used: clinical judgement Functional Limitation: Mobility: Walking and moving around Mobility: Walking and Moving Around Current Status ([id number]([id number]): At least 80 percent but less than 100 percent impaired, limited or restricted Mobility: Walking and Moving Around Goal Status [phone]([id number]): At least 20 percent but less than 40 percent impaired, limited or  restricted  Time: 4098-1191 PT Time Calculation (min) (ACUTE ONLY): 33 min   Charges:   PT Evaluation $PT Eval Moderate Complexity: 1 Procedure PT Treatments $Therapeutic Activity: 23-37 mins   PT G Codes:   PT G-Codes **NOT FOR INPATIENT CLASS** Functional Assessment Tool Used: clinical judgement Functional Limitation: Mobility: Walking and moving around Mobility: Walking and Moving Around Current Status (Y7829): At least 80 percent but less than 100 percent impaired, limited or restricted Mobility: Walking and Moving Around Goal Status [phone]): At least 20 percent but less than 40 percent impaired, limited or restricted   Shizue Kaseman H. Manson Passey, PT, DPT, NCS 05/21/16, 5:13 PM [phone]

## 2016-05-21 NOTE — ED Notes (Signed)
Helped pt to the toilet, with pt yelling the whole time stating the medication didn't work and she "can't live like this". Pt able to make it to the toilet then refused to walk back. Was able to stand her up and pull up her underwear, lower the lid and let her sit back on the bench. Spoke briefly with dr Shaune Pollacklord, then asked pt if she was interested in rehab since unable to control her pain. Pt would never answer. Was able to get pt back to the bed but laying with her legs off the bed refusing to let anyone help the rest of the way in the bed.

## 2016-05-21 NOTE — Progress Notes (Signed)
LCSW met with patient and family and discussed the benefits of STR in a SNF- It was explained they do not meet in patient qualifying stay as they have primary medicare/BCBS secondary. They understand and if need be they will private pay  For 2-3 weeks.   Patient then decided she will not leave her husband and wants home PT and Home health care providers.  PT therapist is assessing patient now. She already has a follow up appointment with an orthopaedic surgeon on Jan 4th/17 and will do what ever the doctor says.  LCSW collected information to complete assessment and Fl2 and request Passr # and will send out for bed offers.  EDP consulted and she put in a care manager consult.  [employer] LCSW [phone]

## 2016-05-21 NOTE — Discharge Instructions (Signed)
As you discuss I'm adding ibuprofen for baseline level pain control. In addition you may try increasing her dose of hydrocodone from 1 tablet to 2 tablets up to every 4 hours as needed for pain.  Return to the emergency department for any worsening symptoms including worsening pain, inability to walk, incontinence of stool or urine, new weakness or numbness, or any other symptoms concerning to you.

## 2016-05-21 NOTE — Clinical Social Work Note (Signed)
Clinical Social Work Assessment  Patient Details  Name: Kirsten Reed MRN: [id number] Date of Birth: 07/12/1930  Date of referral:  05/21/16               Reason for consult:  Facility Placement, Discharge Planning                Permission sought to share information with:  Family Supports, Magazine features editoracility Contact Representative Permission granted to share information::  Yes, Verbal Permission Granted  Name::     Erskin BurnetBilly S- Husband [phone] Cristal DeerChristopher [phone] ( son)  Agency::  All facilities  Relationship::     Contact Information:  Erskin BurnetBilly S- Husband [phone] Cristal DeerChristopher [phone] ( son)  Housing/Transportation Living arrangements for the past 2 months:  Single Family Home Source of Information:  Patient, Adult Children, Spouse Patient Interpreter Needed:  None Criminal Activity/Legal Involvement Pertinent to Current Situation/Hospitalization:  No - Comment as needed Significant Relationships:  Adult Children, Spouse Lives with:  Adult Children, Spouse Do you feel safe going back to the place where you live?  Yes Need for family participation in patient care:  Yes (Comment)  Care giving concerns: Son and husband concerned she cant walk and will support what ever she decides  Office managerocial Worker assessment / plan: LCSW introduced myself to patient , her husband and son Cristal DeerChristopher ( no HCPOA) Patient is oriented x4. Patient is indecisive as to what care she needs and wants. She is insured with Medicare A/B and has BCBS ( understands she does not meet  Criteria for inpatient 3 night stay) and will pay privately if SNF required. She then reported she would rather go home with home PT and hire some home health people. Will follow the advice of PT recommendations. Patient is fairly active and was walking 2 weeks ago but pain in lower back got worse and her pain medications is not working. She reported she came to ED today to get a shot to reduce pain. LCSW explained the benefits of SNF and  family agreed to send out her information via the hub to see if anyone would accept her. Awaiting PT consult ( In Progress). Patient may likely discharge home. EDP consulted. Family was provided Pharmacist, hospitalresource list for SNF and ElderCare and other senior care resources.  Employment status:  Teacher, adult educationHome-Maker Insurance information:  Higher education careers adviserMedicare (BCBS) PT Recommendations:   (Home health or SNF) Information / Referral to community resources:  Skilled Nursing Facility  Patient/Family's Response to care:  Just don't want her in pain  Patient/Family's Understanding of and Emotional Response to Diagnosis, Current Treatment, and Prognosis:  Patient has an excellent understanding of her pain and back issues and has a follow up appointment with Orthopaedic Surgeon on January 4th.  Emotional Assessment Appearance:  Appears stated age Attitude/Demeanor/Rapport:   (Pleasant and polite) Affect (typically observed):  Accepting, Apprehensive, Pleasant Orientation:  Oriented to Self, Oriented to Place, Oriented to  Time, Oriented to Situation Alcohol / Substance use:  Not Applicable Psych involvement (Current and /or in the community):  No (Comment)  Discharge Needs  Concerns to be addressed:  Care Coordination, Discharge Planning Concerns Readmission within the last 30 days:  No Current discharge risk:  Dependent with Mobility Barriers to Discharge:  Continued Medical Work up   RichgroveBandi, Ramtownlaudine M, LCSW 05/21/2016, 3:35 PM

## 2016-05-21 NOTE — ED Notes (Signed)
Pt up to bathroom and states that pain is worsening. Lord, MD informed new orders received. Family at bedside. Updated on plan of care

## 2016-05-23 DIAGNOSIS — B029 Zoster without complications: Secondary | ICD-10-CM

## 2016-05-23 HISTORY — DX: Zoster without complications: B02.9

## 2016-05-25 ENCOUNTER — Emergency Department
Admission: EM | Admit: 2016-05-25 | Discharge: 2016-05-25 | Disposition: A | Discharge status: Home / Self Care | Payer: Medicare Other | Attending: Emergency Medicine | Admitting: Emergency Medicine

## 2016-05-25 DIAGNOSIS — M545 Low back pain, unspecified: Secondary | ICD-10-CM

## 2016-05-25 DIAGNOSIS — M4856XA Collapsed vertebra, not elsewhere classified, lumbar region, initial encounter for fracture: Secondary | ICD-10-CM | POA: Diagnosis not present

## 2016-05-25 DIAGNOSIS — Z791 Long term (current) use of non-steroidal anti-inflammatories (NSAID): Secondary | ICD-10-CM | POA: Diagnosis not present

## 2016-05-25 MED ORDER — OXYCODONE-ACETAMINOPHEN 5-325 MG PO TABS: 2.0000 | ORAL_TABLET | ORAL | 0 refills | Status: DC | PRN

## 2016-05-25 MED ORDER — HYDROMORPHONE HCL 1 MG/ML IJ SOLN
0.5000 mg | Freq: Once | INTRAMUSCULAR | Status: AC
  Administered 2016-05-25: 0.5 mg via INTRAMUSCULAR
  Filled 2016-05-25: qty 1

## 2016-05-25 MED ORDER — HYDROMORPHONE HCL 1 MG/ML IJ SOLN
0.5000 mg | Freq: Once | INTRAMUSCULAR | Status: AC
  Administered 2016-05-25: 0.5 mg via INTRAVENOUS
  Filled 2016-05-25: qty 1

## 2016-05-25 NOTE — ED Triage Notes (Signed)
Pt was sent from orthopedist for pain control due to compression Fx in spine.. Was seen here 12/30, 12/28

## 2016-05-25 NOTE — ED Provider Notes (Signed)
[facility] Emergency Department Provider Note  ____________________________________________   First MD Initiated Contact with Patient 05/25/16 0932     (approximate)  I have reviewed the triage vital signs and the nursing notes.   HISTORY  Chief Complaint Back Pain (compression Fx)   HPI Kirsten Reed is a 83 y.o. female with a history of spinal compression fractures was presenting to the emergency department today with worsening of her back pain. She says that her pain is a "25 out of 10." She says that the pain is to her lower lumbar region at the midline and the right lumbar region of her back. She denies any difficulty with urination. Said that she also has not had a bowel movement in one week but does not feel like there is stool just about to come out. She denies any abdominal pain. Says that she has been eating better over the past 2 days at the insistence of her sons. She was seen by an orthopedist today and sent to the emergency department for pain control. She has been taking oral opioids at home for pain control without relief. She says that she does not want to be admitted to a rehabilitation or skilled nursing facility at this time. Says that the pain radiates bilaterally down the front of her legs that it hurts to put pressure on her feet.   History reviewed. No pertinent past medical history.  There are no active problems to display for this patient.   Past Surgical History:  Procedure Laterality Date  . NO PAST SURGERIES      Prior to Admission medications   Medication Sig Start Date End Date Taking? Authorizing Provider  HYDROcodone-acetaminophen (NORCO/VICODIN) 5-325 MG tablet Take 2 tablets by mouth every 4 (four) hours as needed for moderate pain or severe pain. 05/21/16   Governor Rooks, MD  ibuprofen (ADVIL,MOTRIN) 600 MG tablet Take 1 tablet (600 mg total) by mouth every 8 (eight) hours as needed. 05/21/16   Governor Rooks, MD    ondansetron (ZOFRAN ODT) 4 MG disintegrating tablet Take 1 tablet (4 mg total) by mouth every 8 (eight) hours as needed for nausea or vomiting. 05/19/16   Minna Antis, MD  oxyCODONE-acetaminophen (ROXICET) 5-325 MG tablet Take 1 tablet by mouth every 6 (six) hours as needed. 05/19/16   Minna Antis, MD    Allergies Sulfa antibiotics  Family History  Problem Relation Age of Onset  . Hypertension Mother   . Diabetes Mother     Social History Social History  Substance Use Topics  . Smoking status: Never Smoker  . Smokeless tobacco: Never Used  . Alcohol use No    Review of Systems Constitutional: No fever/chills Eyes: No visual changes. ENT: No sore throat. Cardiovascular: Denies chest pain. Respiratory: Denies shortness of breath. Gastrointestinal: No abdominal pain.  No nausea, no vomiting.  No diarrhea.   Genitourinary: Negative for dysuria. Musculoskeletal: As above Skin: Negative for rash. Neurological: Negative for headaches, focal weakness or numbness.  10-point ROS otherwise negative.  ____________________________________________   PHYSICAL EXAM:  VITAL SIGNS: ED Triage Vitals with a history of   Enc Vitals Group     BP (!) 134/58     Pulse Rate (!) 106     Resp 18     Temp 98.8 F (37.1 C)     Temp Source Oral     SpO2 100 %     Weight 120 lb (54.4 kg)     Height 5\' 1"  (  1.549 m)     Head Circumference      Peak Flow      Pain Score 10     Pain Loc      Pain Edu?      Excl. in GC?     Constitutional: Alert and oriented. Well appearing and in no acute distress. Eyes: Conjunctivae are normal. PERRL. EOMI. Head: Atraumatic. Nose: No congestion/rhinnorhea. Mouth/Throat: Mucous membranes are moist.  Neck: No stridor.   Cardiovascular: Normal rate, regular rhythm. Grossly normal heart sounds.   Respiratory: Normal respiratory effort.  No retractions. Lungs CTAB. Gastrointestinal: Soft and nontender. No distention. No abdominal bruits. No  CVA tenderness. Musculoskeletal: No lower extremity tenderness nor edema.  No joint effusions.Able to range the hips actively and passively bilaterally without any restriction. No tenderness to palpation to the lumbar region. No saddle anesthesia. Neurologic:  Normal speech and language. No gross focal neurologic deficits are appreciated. No gait instability. Skin:  Skin is warm, dry and intact. No rash noted. Psychiatric: Mood and affect are normal. Speech and behavior are normal.  ____________________________________________   LABS (all labs ordered are listed, but only abnormal results are displayed)  Labs Reviewed - No data to display ____________________________________________  EKG   ____________________________________________  RADIOLOGY   ____________________________________________   PROCEDURES  Procedure(s) performed: None  Procedures  Critical Care performed:   ____________________________________________   INITIAL IMPRESSION / ASSESSMENT AND PLAN / ED COURSE  Pertinent labs & imaging results that were available during my care of the patient were reviewed by me and considered in my medical decision making (see chart for details).    Clinical Course    ----------------------------------------- 1:33 PM on 05/25/2016 -----------------------------------------  Patient with pain relief after 2 doses of Dilaudid. We discussed home care and the patient and family also discussed options with care management. The patient will be discharged home with oxycodone. She was previously on one tab every 6-8 hours. I will increase her dose to 2 tabs every 4 hours.  She'll also try other medications for constipation including Colace, Metamucil, magnesium or a Fleet enema as needed. She is understanding of these plans as well as her family and they're willing to comply.   ____________________________________________   FINAL CLINICAL IMPRESSION(S) / ED DIAGNOSES  Acute  on chronic back pain. Constipation.     NEW MEDICATIONS STARTED DURING THIS VISIT:  New Prescriptions   No medications on file     Note:  This document was prepared using Dragon voice recognition software and may include unintentional dictation errors.    Myrna Blazeravid Matthew Beckem Tomberlin, MD 05/25/16 [phone]

## 2016-05-25 NOTE — Care Management Note (Signed)
Case Management Note  Patient Details  Name: Kirsten RowanCarol Reed MRN: [id number] Date of Birth: 1931/02/02  Subjective/Objective:      Spoke to family at bedside for some time. They all agree that the patient can go home safely. She is experiencing constipation due to the pain meds, and I have advised them to try warm liquids other than coffee, and fruit such as prunes or pumpkin. They have agreed and the son is willing to help the patient at home.              Action/Plan:   Expected Discharge Date:                  Expected Discharge Plan:     In-House Referral:     Discharge planning Services     Post Acute Care Choice:    Choice offered to:     DME Arranged:    DME Agency:     HH Arranged:    HH Agency:     Status of Service:     If discussed at [employer] Length of Stay Meetings, dates discussed:    Additional Comments:  Berna BueCheryl Macen Joslin, RN 05/25/2016, 1:16 PM

## 2016-05-30 DIAGNOSIS — W19XXXA Unspecified fall, initial encounter: Secondary | ICD-10-CM | POA: Diagnosis not present

## 2016-05-30 DIAGNOSIS — M545 Low back pain: Secondary | ICD-10-CM | POA: Diagnosis not present

## 2016-05-30 DIAGNOSIS — R269 Unspecified abnormalities of gait and mobility: Secondary | ICD-10-CM | POA: Diagnosis not present

## 2016-05-30 DIAGNOSIS — R2689 Other abnormalities of gait and mobility: Secondary | ICD-10-CM | POA: Diagnosis not present

## 2016-05-30 DIAGNOSIS — Y92099 Unspecified place in other non-institutional residence as the place of occurrence of the external cause: Secondary | ICD-10-CM | POA: Diagnosis not present

## 2016-06-03 DIAGNOSIS — Z9181 History of falling: Secondary | ICD-10-CM | POA: Diagnosis not present

## 2016-06-03 DIAGNOSIS — M79651 Pain in right thigh: Secondary | ICD-10-CM | POA: Diagnosis not present

## 2016-06-03 DIAGNOSIS — M79652 Pain in left thigh: Secondary | ICD-10-CM | POA: Diagnosis not present

## 2016-06-07 DIAGNOSIS — Z9181 History of falling: Secondary | ICD-10-CM | POA: Diagnosis not present

## 2016-06-07 DIAGNOSIS — M79652 Pain in left thigh: Secondary | ICD-10-CM | POA: Diagnosis not present

## 2016-06-07 DIAGNOSIS — M79651 Pain in right thigh: Secondary | ICD-10-CM | POA: Diagnosis not present

## 2016-06-13 DIAGNOSIS — M79652 Pain in left thigh: Secondary | ICD-10-CM | POA: Diagnosis not present

## 2016-06-13 DIAGNOSIS — Z9181 History of falling: Secondary | ICD-10-CM | POA: Diagnosis not present

## 2016-06-13 DIAGNOSIS — M79651 Pain in right thigh: Secondary | ICD-10-CM | POA: Diagnosis not present

## 2016-06-15 DIAGNOSIS — M79652 Pain in left thigh: Secondary | ICD-10-CM | POA: Diagnosis not present

## 2016-06-15 DIAGNOSIS — M79651 Pain in right thigh: Secondary | ICD-10-CM | POA: Diagnosis not present

## 2016-06-15 DIAGNOSIS — Z9181 History of falling: Secondary | ICD-10-CM | POA: Diagnosis not present

## 2016-06-17 DIAGNOSIS — M79651 Pain in right thigh: Secondary | ICD-10-CM | POA: Diagnosis not present

## 2016-06-17 DIAGNOSIS — Z9181 History of falling: Secondary | ICD-10-CM | POA: Diagnosis not present

## 2016-06-17 DIAGNOSIS — M79652 Pain in left thigh: Secondary | ICD-10-CM | POA: Diagnosis not present

## 2016-06-20 DIAGNOSIS — M79651 Pain in right thigh: Secondary | ICD-10-CM | POA: Diagnosis not present

## 2016-06-20 DIAGNOSIS — M79652 Pain in left thigh: Secondary | ICD-10-CM | POA: Diagnosis not present

## 2016-06-20 DIAGNOSIS — Z9181 History of falling: Secondary | ICD-10-CM | POA: Diagnosis not present

## 2016-06-21 DIAGNOSIS — M79652 Pain in left thigh: Secondary | ICD-10-CM | POA: Diagnosis not present

## 2016-06-21 DIAGNOSIS — Z9181 History of falling: Secondary | ICD-10-CM | POA: Diagnosis not present

## 2016-06-21 DIAGNOSIS — M79651 Pain in right thigh: Secondary | ICD-10-CM | POA: Diagnosis not present

## 2016-06-22 DIAGNOSIS — M79651 Pain in right thigh: Secondary | ICD-10-CM | POA: Diagnosis not present

## 2016-06-22 DIAGNOSIS — M79652 Pain in left thigh: Secondary | ICD-10-CM | POA: Diagnosis not present

## 2016-06-22 DIAGNOSIS — Z9181 History of falling: Secondary | ICD-10-CM | POA: Diagnosis not present

## 2016-06-24 DIAGNOSIS — Z9181 History of falling: Secondary | ICD-10-CM | POA: Diagnosis not present

## 2016-06-24 DIAGNOSIS — M79652 Pain in left thigh: Secondary | ICD-10-CM | POA: Diagnosis not present

## 2016-06-24 DIAGNOSIS — M79651 Pain in right thigh: Secondary | ICD-10-CM | POA: Diagnosis not present

## 2016-06-27 DIAGNOSIS — M79652 Pain in left thigh: Secondary | ICD-10-CM | POA: Diagnosis not present

## 2016-06-27 DIAGNOSIS — M79651 Pain in right thigh: Secondary | ICD-10-CM | POA: Diagnosis not present

## 2016-06-27 DIAGNOSIS — Z9181 History of falling: Secondary | ICD-10-CM | POA: Diagnosis not present

## 2016-06-29 DIAGNOSIS — M79652 Pain in left thigh: Secondary | ICD-10-CM | POA: Diagnosis not present

## 2016-06-29 DIAGNOSIS — Z9181 History of falling: Secondary | ICD-10-CM | POA: Diagnosis not present

## 2016-06-29 DIAGNOSIS — M79651 Pain in right thigh: Secondary | ICD-10-CM | POA: Diagnosis not present

## 2016-06-30 DIAGNOSIS — M79652 Pain in left thigh: Secondary | ICD-10-CM | POA: Diagnosis not present

## 2016-06-30 DIAGNOSIS — M79651 Pain in right thigh: Secondary | ICD-10-CM | POA: Diagnosis not present

## 2016-06-30 DIAGNOSIS — Z9181 History of falling: Secondary | ICD-10-CM | POA: Diagnosis not present

## 2016-07-01 ENCOUNTER — Ambulatory Visit
Admission: EM | Admit: 2016-07-01 | Discharge: 2016-07-01 | Disposition: A | Discharge status: Home / Self Care | Payer: Medicare Other | Attending: Family Medicine | Admitting: Family Medicine

## 2016-07-01 ENCOUNTER — Encounter: Payer: Self-pay | Admitting: Gynecology

## 2016-07-01 DIAGNOSIS — M545 Low back pain, unspecified: Secondary | ICD-10-CM

## 2016-07-01 DIAGNOSIS — Z79899 Other long term (current) drug therapy: Secondary | ICD-10-CM | POA: Insufficient documentation

## 2016-07-01 DIAGNOSIS — Z888 Allergy status to other drugs, medicaments and biological substances status: Secondary | ICD-10-CM | POA: Insufficient documentation

## 2016-07-01 DIAGNOSIS — Z833 Family history of diabetes mellitus: Secondary | ICD-10-CM | POA: Insufficient documentation

## 2016-07-01 DIAGNOSIS — B029 Zoster without complications: Secondary | ICD-10-CM | POA: Insufficient documentation

## 2016-07-01 DIAGNOSIS — N3000 Acute cystitis without hematuria: Secondary | ICD-10-CM | POA: Diagnosis not present

## 2016-07-01 DIAGNOSIS — Z8249 Family history of ischemic heart disease and other diseases of the circulatory system: Secondary | ICD-10-CM | POA: Diagnosis not present

## 2016-07-01 HISTORY — DX: Fracture of unspecified carpal bone, unspecified wrist, initial encounter for closed fracture: S62.109A

## 2016-07-01 LAB — URINALYSIS, COMPLETE (UACMP) WITH MICROSCOPIC

## 2016-07-01 MED ORDER — VALACYCLOVIR HCL 500 MG PO TABS: 500.0000 mg | ORAL_TABLET | Freq: Two times a day (BID) | ORAL | 1 refills | Status: DC

## 2016-07-01 MED ORDER — METHYLPREDNISOLONE SODIUM SUCC 125 MG IJ SOLR: 125.0000 mg | Freq: Once | INTRAMUSCULAR | Status: DC

## 2016-07-01 MED ORDER — LIDOCAINE 5 % EX PTCH: 1.0000 | MEDICATED_PATCH | CUTANEOUS | 0 refills | Status: DC

## 2016-07-01 MED ORDER — CIPROFLOXACIN HCL 500 MG PO TABS: 500.0000 mg | ORAL_TABLET | Freq: Two times a day (BID) | ORAL | 0 refills | Status: DC

## 2016-07-01 MED ORDER — METHYLPREDNISOLONE SODIUM SUCC 125 MG IJ SOLR
125.0000 mg | Freq: Once | INTRAMUSCULAR | Status: AC
  Administered 2016-07-01: 125 mg via INTRAMUSCULAR

## 2016-07-01 NOTE — ED Triage Notes (Signed)
Per patient with back pain. Patient stated had shingles x 2 week ago. Per patient sill have rash on her back.

## 2016-07-01 NOTE — ED Provider Notes (Signed)
CSN: [id number]     Arrival date & time 07/01/16  1331 History   First MD Initiated Contact with Patient 07/01/16 1435     Chief Complaint  Patient presents with  . Back Pain   (Consider location/radiation/quality/duration/timing/severity/associated sxs/prior Treatment) Pt c/o frequency and lower back pain. States that she is being treated for shingles to LT lower back for 7 weeks now. States that he pain is unbearable. She has been taking Vicodin, percocet, with no relief. urinary sx began 3 days ago and she started taking azo with no relief. Denies any n/v/d no fevers.       Past Medical History:  Diagnosis Date  . Broken wrist    Past Surgical History:  Procedure Laterality Date  . NO PAST SURGERIES     Family History  Problem Relation Age of Onset  . Hypertension Mother   . Diabetes Mother    Social History  Substance Use Topics  . Smoking status: Never Smoker  . Smokeless tobacco: Never Used  . Alcohol use No   OB History    No data available     Review of Systems  Constitutional: Negative.   Respiratory: Negative.   Cardiovascular: Negative.   Gastrointestinal: Negative.   Genitourinary: Positive for difficulty urinating, frequency and urgency.  Musculoskeletal:       Lt flank and back pain from shingles that she is treating for 7 weeks   Skin: Positive for rash.       Red flat rash with dried blisters to lt flank radiates to lower back   Neurological:       Neuropathy pain and tingling to lt flank     Allergies  Sulfa antibiotics  Home Medications   Prior to Admission medications   Medication Sig Start Date End Date Taking? Authorizing Provider  gabapentin (NEURONTIN) 300 MG capsule Take 300 mg by mouth 3 (three) times daily.   Yes Historical Provider, MD  ibuprofen (ADVIL,MOTRIN) 600 MG tablet Take 1 tablet (600 mg total) by mouth every 8 (eight) hours as needed. 05/21/16  Yes Governor Rooksebecca Lord, MD  HYDROcodone-acetaminophen (NORCO/VICODIN) 5-325 MG  tablet Take 2 tablets by mouth every 4 (four) hours as needed for moderate pain or severe pain. 05/21/16   Governor Rooksebecca Lord, MD  lidocaine (LIDODERM) 5 % Place 1 patch onto the skin daily. Remove & Discard patch within 12 hours or as directed by MD 07/01/16   Tobi BastosMelanie A Pernell Lenoir, NP  ondansetron (ZOFRAN ODT) 4 MG disintegrating tablet Take 1 tablet (4 mg total) by mouth every 8 (eight) hours as needed for nausea or vomiting. 05/19/16   Minna AntisKevin Paduchowski, MD  oxyCODONE-acetaminophen (ROXICET) 5-325 MG tablet Take 2 tablets by mouth every 4 (four) hours as needed for moderate pain or severe pain. 05/25/16   Myrna Blazeravid Matthew Schaevitz, MD  valACYclovir (VALTREX) 500 MG tablet Take 1 tablet (500 mg total) by mouth 2 (two) times daily. 07/01/16   Tobi BastosMelanie A Shatima Zalar, NP   Meds Ordered and Administered this Visit   Medications  methylPREDNISolone sodium succinate (SOLU-MEDROL) 125 mg/2 mL injection 125 mg (not administered)    BP (!) 108/50 (BP Location: Right Arm)   Pulse 98   Temp 98.7 F (37.1 C) (Oral)   Resp 16   Wt 115 lb (52.2 kg)   SpO2 100%   BMI 21.73 kg/m  No data found.   Physical Exam  Constitutional: She appears well-developed.  Cardiovascular: Normal rate, regular rhythm and normal heart sounds.   Pulmonary/Chest: Effort  normal and breath sounds normal.  Abdominal: Soft. Bowel sounds are normal.  Musculoskeletal: Normal range of motion.  Neurological: She is alert.  Skin: Rash noted.  lg area to lt flank radiates to lower back scabbed over blisters shingles pattern.     Urgent Care Course     Procedures (including critical care time)  Labs Review Labs Reviewed  URINALYSIS, COMPLETE (UACMP) WITH MICROSCOPIC - Abnormal; Notable for the following:       Result Value   Color, Urine ORANGE (*)    APPearance TURBID (*)    Glucose, UA   (*)    Value: TEST NOT REPORTED DUE TO COLOR INTERFERENCE OF URINE PIGMENT   Hgb urine dipstick   (*)    Value: TEST NOT REPORTED DUE TO COLOR  INTERFERENCE OF URINE PIGMENT   Bilirubin Urine   (*)    Value: TEST NOT REPORTED DUE TO COLOR INTERFERENCE OF URINE PIGMENT   Ketones, ur   (*)    Value: TEST NOT REPORTED DUE TO COLOR INTERFERENCE OF URINE PIGMENT   Protein, ur   (*)    Value: TEST NOT REPORTED DUE TO COLOR INTERFERENCE OF URINE PIGMENT   Nitrite   (*)    Value: TEST NOT REPORTED DUE TO COLOR INTERFERENCE OF URINE PIGMENT   Leukocytes, UA   (*)    Value: TEST NOT REPORTED DUE TO COLOR INTERFERENCE OF URINE PIGMENT   Squamous Epithelial / LPF 0-5 (*)    Bacteria, UA MANY (*)    All other components within normal limits  URINE CULTURE    Imaging Review No results found.           MDM   1. Acute left-sided low back pain without sciatica   2. Herpes zoster without complication   3. Acute cystitis without hematuria    discussed not taking narcotics to avoid abuse Will give lidocaine patches to help with pain. Give antiviral  Given shot in office to help some with pain Also your urine has large amount of bacteria will send for culture and treat for UTI.  Given resources to find a pcp  Reviewed previous visits and treatments     Tobi Bastos, NP 07/01/16 1457    Tobi Bastos, NP 07/01/16 [phone]

## 2016-07-03 LAB — URINE CULTURE: Culture: >=100000 — AB

## 2016-07-05 DIAGNOSIS — M79652 Pain in left thigh: Secondary | ICD-10-CM | POA: Diagnosis not present

## 2016-07-05 DIAGNOSIS — Z9181 History of falling: Secondary | ICD-10-CM | POA: Diagnosis not present

## 2016-07-05 DIAGNOSIS — M79651 Pain in right thigh: Secondary | ICD-10-CM | POA: Diagnosis not present

## 2016-07-07 DIAGNOSIS — M79652 Pain in left thigh: Secondary | ICD-10-CM | POA: Diagnosis not present

## 2016-07-07 DIAGNOSIS — M79651 Pain in right thigh: Secondary | ICD-10-CM | POA: Diagnosis not present

## 2016-07-07 DIAGNOSIS — Z9181 History of falling: Secondary | ICD-10-CM | POA: Diagnosis not present

## 2016-07-12 DIAGNOSIS — Z9181 History of falling: Secondary | ICD-10-CM | POA: Diagnosis not present

## 2016-07-12 DIAGNOSIS — M79652 Pain in left thigh: Secondary | ICD-10-CM | POA: Diagnosis not present

## 2016-07-12 DIAGNOSIS — M79651 Pain in right thigh: Secondary | ICD-10-CM | POA: Diagnosis not present

## 2016-07-21 DIAGNOSIS — M79652 Pain in left thigh: Secondary | ICD-10-CM | POA: Diagnosis not present

## 2016-07-21 DIAGNOSIS — M79651 Pain in right thigh: Secondary | ICD-10-CM | POA: Diagnosis not present

## 2016-07-21 DIAGNOSIS — Z9181 History of falling: Secondary | ICD-10-CM | POA: Diagnosis not present

## 2016-12-14 DIAGNOSIS — M439 Deforming dorsopathy, unspecified: Secondary | ICD-10-CM | POA: Diagnosis not present

## 2016-12-14 DIAGNOSIS — Z7689 Persons encountering health services in other specified circumstances: Secondary | ICD-10-CM | POA: Diagnosis not present

## 2016-12-14 DIAGNOSIS — B0229 Other postherpetic nervous system involvement: Secondary | ICD-10-CM | POA: Diagnosis not present

## 2016-12-14 DIAGNOSIS — M48061 Spinal stenosis, lumbar region without neurogenic claudication: Secondary | ICD-10-CM | POA: Diagnosis not present

## 2016-12-14 DIAGNOSIS — M419 Scoliosis, unspecified: Secondary | ICD-10-CM | POA: Diagnosis not present

## 2017-04-25 DIAGNOSIS — H25813 Combined forms of age-related cataract, bilateral: Secondary | ICD-10-CM | POA: Diagnosis not present

## 2017-06-03 ENCOUNTER — Other Ambulatory Visit: Payer: Self-pay

## 2017-06-03 ENCOUNTER — Ambulatory Visit
Admission: EM | Admit: 2017-06-03 | Discharge: 2017-06-03 | Disposition: A | Discharge status: Home / Self Care | Payer: Medicare Other | Attending: Family Medicine | Admitting: Family Medicine

## 2017-06-03 ENCOUNTER — Encounter: Payer: Self-pay | Admitting: Emergency Medicine

## 2017-06-03 ENCOUNTER — Ambulatory Visit: Payer: Medicare Other

## 2017-06-03 DIAGNOSIS — N281 Cyst of kidney, acquired: Secondary | ICD-10-CM | POA: Insufficient documentation

## 2017-06-03 DIAGNOSIS — H25019 Cortical age-related cataract, unspecified eye: Secondary | ICD-10-CM | POA: Insufficient documentation

## 2017-06-03 DIAGNOSIS — M4856XS Collapsed vertebra, not elsewhere classified, lumbar region, sequela of fracture: Secondary | ICD-10-CM | POA: Insufficient documentation

## 2017-06-03 DIAGNOSIS — S22000A Wedge compression fracture of unspecified thoracic vertebra, initial encounter for closed fracture: Secondary | ICD-10-CM

## 2017-06-03 DIAGNOSIS — Z8249 Family history of ischemic heart disease and other diseases of the circulatory system: Secondary | ICD-10-CM | POA: Diagnosis not present

## 2017-06-03 DIAGNOSIS — Z87311 Personal history of (healed) other pathological fracture: Secondary | ICD-10-CM | POA: Insufficient documentation

## 2017-06-03 DIAGNOSIS — M8580 Other specified disorders of bone density and structure, unspecified site: Secondary | ICD-10-CM | POA: Diagnosis not present

## 2017-06-03 DIAGNOSIS — M419 Scoliosis, unspecified: Secondary | ICD-10-CM | POA: Insufficient documentation

## 2017-06-03 DIAGNOSIS — Z79891 Long term (current) use of opiate analgesic: Secondary | ICD-10-CM | POA: Insufficient documentation

## 2017-06-03 DIAGNOSIS — E785 Hyperlipidemia, unspecified: Secondary | ICD-10-CM | POA: Insufficient documentation

## 2017-06-03 DIAGNOSIS — Z791 Long term (current) use of non-steroidal anti-inflammatories (NSAID): Secondary | ICD-10-CM | POA: Insufficient documentation

## 2017-06-03 DIAGNOSIS — Z79899 Other long term (current) drug therapy: Secondary | ICD-10-CM | POA: Insufficient documentation

## 2017-06-03 DIAGNOSIS — M479 Spondylosis, unspecified: Secondary | ICD-10-CM | POA: Diagnosis not present

## 2017-06-03 DIAGNOSIS — B0229 Other postherpetic nervous system involvement: Secondary | ICD-10-CM | POA: Insufficient documentation

## 2017-06-03 DIAGNOSIS — Z833 Family history of diabetes mellitus: Secondary | ICD-10-CM | POA: Insufficient documentation

## 2017-06-03 DIAGNOSIS — M4854XA Collapsed vertebra, not elsewhere classified, thoracic region, initial encounter for fracture: Secondary | ICD-10-CM | POA: Insufficient documentation

## 2017-06-03 DIAGNOSIS — M549 Dorsalgia, unspecified: Secondary | ICD-10-CM | POA: Diagnosis present

## 2017-06-03 DIAGNOSIS — M545 Low back pain: Secondary | ICD-10-CM | POA: Diagnosis not present

## 2017-06-03 DIAGNOSIS — Z882 Allergy status to sulfonamides status: Secondary | ICD-10-CM | POA: Insufficient documentation

## 2017-06-03 DIAGNOSIS — M546 Pain in thoracic spine: Secondary | ICD-10-CM | POA: Diagnosis not present

## 2017-06-03 DIAGNOSIS — M48061 Spinal stenosis, lumbar region without neurogenic claudication: Secondary | ICD-10-CM | POA: Insufficient documentation

## 2017-06-03 DIAGNOSIS — G8929 Other chronic pain: Secondary | ICD-10-CM | POA: Diagnosis not present

## 2017-06-03 DIAGNOSIS — K8020 Calculus of gallbladder without cholecystitis without obstruction: Secondary | ICD-10-CM | POA: Insufficient documentation

## 2017-06-03 MED ORDER — KETOROLAC TROMETHAMINE 30 MG/ML IJ SOLN
30.0000 mg | Freq: Once | INTRAMUSCULAR | Status: AC
  Administered 2017-06-03: 30 mg via INTRAMUSCULAR

## 2017-06-03 MED ORDER — HYDROCODONE-ACETAMINOPHEN 5-325 MG PO TABS: 1.0000 | ORAL_TABLET | Freq: Four times a day (QID) | ORAL | 0 refills | Status: DC | PRN

## 2017-06-03 NOTE — ED Triage Notes (Signed)
Patient states that she was treated for Shingles a year ago and has had lower back pain for a year.  Patient denies any rash.

## 2017-06-03 NOTE — Discharge Instructions (Signed)
Call your doctor about seeing Dr. Yves Dillhasnis.  Pain medication as prescribed.  Take care  Dr. Adriana Simasook

## 2017-06-03 NOTE — ED Provider Notes (Signed)
[facility]    CSN: [id number] Arrival date & time: 06/03/17  1320  History   Chief Complaint Chief Complaint  Patient presents with  . Back Pain   HPI  84 year old female with a history of chronic back pain and compression fractures presents with back pain.  Patient states that she has had back pain over the past year.  She states that is been bearable until this past week when it became severe.  It is now interfering with her ability to do her normal activities.  Pain is severe, 10/10 in severity.  Pain is located in the lower thoracic and lumbar spines.  She reports radiation down the legs intermittently.  No saddle anesthesia or incontinence.  She has taken ibuprofen and Aleve but could not tolerate the medication.  She states that she is in severe pain and wants something for the pain.  She states that "I cannot live like this".  She has seen her primary care provider and was told that this is not going to improve.  Worse with activity.  No relieving factors.  She is in need of pain relief today.  Fracture of wrist 11/13/2012 Rt impacted displaced & angulate fx of Rt distal metaphysis. Displaced ulnar styloid fx. Avulsion distal aspect of the scaphoid.  Fracture of arm 05/03/2011 2/2 fall. Fx distal Lt radius & ulna. DJD at carpus.  Hyperlipidemia, unspecified  Documented in past, but not followed up for long time.  Osteopenia  Based on x-ray appearance of femur in Spring 2018.  Shingles 2018 abdomen  Post herpetic neuralgia 2018 Abdomen  Cataract cortical, senile    Scoliosis of lumbar spine 05/19/2016 Convex left scoliosis with multilevel spondylosis. Resulting in moderate to severe multifactorial spinal stenosis at L3-4 and L4-5.  Spinal stenosis of lumbar region 05/19/2016 Convex left scoliosis with multilevel spondylosis. Resulting in moderate to severe multifactorial spinal stenosis at L3-4 and L4-5.  Compression deformity of vertebra 05/19/2016 1. Mild inferior  endplate compression deformity at L4, likely acute. 2. Chronic appearing superior endplate compression deformity at L1.  Cholelithiasis 05/19/2016 Incidental finding on CT for back pain.  Atherosclerosis 05/19/2016 Incidental finding on CT for back pain.  Humeral fracture 05/03/2011 2/2 fall. Proximal Lt humeral fx w/ displacement.  Cyst of right kidney 2009    Past Surgical History:  Procedure Laterality Date  . NO PAST SURGERIES      OB History    No data available     Home Medications    Prior to Admission medications   Medication Sig Start Date End Date Taking? Authorizing Provider  HYDROcodone-acetaminophen (NORCO/VICODIN) 5-325 MG tablet Take 1 tablet by mouth every 6 (six) hours as needed for moderate pain or severe pain. 06/03/17   Tommie Sams, DO  ibuprofen (ADVIL,MOTRIN) 600 MG tablet Take 1 tablet (600 mg total) by mouth every 8 (eight) hours as needed. 05/21/16   Governor Rooks, MD  lidocaine (LIDODERM) 5 % Place 1 patch onto the skin daily. Remove & Discard patch within 12 hours or as directed by MD 07/01/16   Coralyn Mark, NP   Family History Heart disease Father    Diabetes type II Mother    High blood pressure (Hypertension) Mother     Social History Social History   Tobacco Use  . Smoking status: Never Smoker  . Smokeless tobacco: Never Used  Substance Use Topics  . Alcohol use: No    Alcohol/week: 0.0 oz  . Drug use: No   Allergies  Sulfa antibiotics   Review of Systems Review of Systems  Constitutional: Negative.   Genitourinary: Negative.   Musculoskeletal: Positive for back pain.   Physical Exam Triage Vital Signs ED Triage Vitals  Enc Vitals Group     BP 06/03/17 1336 (!) 139/56     Pulse Rate 06/03/17 1336 (!) 103     Resp 06/03/17 1336 16     Temp 06/03/17 1336 98 F (36.7 C)     Temp Source 06/03/17 1336 Oral     SpO2 06/03/17 1336 99 %     Weight 06/03/17 1333 114 lb (51.7 kg)     Height 06/03/17 1333 4\' 11"  (1.499 m)       Head Circumference --      Peak Flow --      Pain Score 06/03/17 1334 10     Pain Loc --      Pain Edu? --      Excl. in GC? --    Updated Vital Signs BP (!) 139/56 (BP Location: Left Arm)   Pulse (!) 103   Temp 98 F (36.7 C) (Oral)   Resp 16   Ht 4\' 11"  (1.499 m)   Wt 114 lb (51.7 kg)   SpO2 99%   BMI 23.03 kg/m   Physical Exam  Constitutional: She is oriented to person, place, and time. She appears well-developed and well-nourished. No distress.  Cardiovascular: Regular rhythm.  Tachycardic.  Soft systolic murmur.  Pulmonary/Chest: Effort normal and breath sounds normal. She has no wheezes. She has no rales.  Musculoskeletal:  Lumbar spine exquisitely tender to palpation.  Decreased range of motion secondary to pain.  Neurological: She is alert and oriented to person, place, and time.  Psychiatric: She has a normal mood and affect. Her behavior is normal.  Nursing note and vitals reviewed.  UC Treatments / Results  Labs (all labs ordered are listed, but only abnormal results are displayed) Labs Reviewed - No data to display  EKG  EKG Interpretation None       Radiology Dg Thoracic Spine 2 View  Result Date: 06/03/2017 CLINICAL DATA:  Back pain. EXAM: THORACIC SPINE 2 VIEWS COMPARISON:  None. FINDINGS: Vertebral bodies are osteopenic. Mild loss of height of T5 and T9. There is a rightward convex scoliosis. No bony lesions identified. IMPRESSION: Mild loss of height at the T5 and T9 levels. Thoracic scoliosis present. Electronically Signed   By: Irish Lack M.D.   On: 06/03/2017 14:48   Dg Lumbar Spine Complete  Result Date: 06/03/2017 CLINICAL DATA:  Back pain. EXAM: LUMBAR SPINE - COMPLETE 4+ VIEW COMPARISON:  CT of the lumbar spine on 05/19/2016 FINDINGS: Vertebral bodies are diffusely osteopenic. Mild loss of height of the L1 vertebral body present with approximately 30% overall compression. This appears similar to the prior CT in appearance. There is a  progressive compression deformity of the L4 vertebral body. The L4 vertebral body is very irregular in appearance. A pathologic compression fracture is not entirely excluded by x-ray. Loss of height at L4 is asymmetric with maximal degree of loss of height of likely greater than 50%. Calcified plaque present in the abdominal aorta and common iliac arteries. IMPRESSION: 1. Mild loss of height of the L1 vertebral body appears fairly stable since prior CT of the lumbar spine in 2017. 2. Progressive loss of height of the L4 vertebral body which is at least 50% compressed. The L4 vertebral body is very irregular in appearance. A pathologic compression fracture  is not entirely excluded by x-ray. Consider MRI evaluation of the lumbar spine with and without gadolinium. Electronically Signed   By: Irish LackGlenn  Yamagata M.D.   On: 06/03/2017 14:42    Procedures Procedures (including critical care time)  Medications Ordered in UC Medications  ketorolac (TORADOL) 30 MG/ML injection 30 mg (30 mg Intramuscular Given 06/03/17 1428)   Initial Impression / Assessment and Plan / UC Course  I have reviewed the triage vital signs and the nursing notes.  Pertinent labs & imaging results that were available during my care of the patient were reviewed by me and considered in my medical decision making (see chart for details).     84 year old female presents with acute on chronic back pain.  Patient has evidence of multiple compression fractures.  Is also had CT evidence of spondylosis and spinal stenosis.  Patient was given Toradol here today given acute pain.  I have advised her to see Dr. Yves Dillhasnis for consideration for injection therapy.  Treating pain with Vicodin (5-day supply given per [place] stop act)  Final Clinical Impressions(s) / UC Diagnoses   Final diagnoses:  Chronic midline low back pain, with sciatica presence unspecified  Compression fracture of body of thoracic vertebra (HCC)  Non-traumatic compression  fracture of fourth lumbar vertebra, sequela    ED Discharge Orders        Ordered    HYDROcodone-acetaminophen (NORCO/VICODIN) 5-325 MG tablet  Every 6 hours PRN     06/03/17 1453     Controlled Substance Prescriptions [place] Controlled Substance Registry consulted? Yes, I have consulted the  Controlled Substances Registry for this patient, and feel the risk/benefit ratio today is favorable for proceeding with this prescription for a controlled substance. **She has not had a controlled substance prescription since January 2018.   Tommie SamsCook, Jia Dottavio G, DO 06/03/17 1500

## 2017-06-06 ENCOUNTER — Telehealth: Payer: Self-pay | Admitting: Emergency Medicine

## 2017-06-06 NOTE — Telephone Encounter (Signed)
Called patient to follow-up to see how patient was doing [facility]  Patient states that she is doing a little better.  Patient states that [facility] is [facility] and for future follow-up.

## 2017-06-12 ENCOUNTER — Other Ambulatory Visit: Payer: Self-pay | Admitting: Nurse Practitioner

## 2017-06-12 DIAGNOSIS — M48062 Spinal stenosis, lumbar region with neurogenic claudication: Secondary | ICD-10-CM

## 2017-06-12 DIAGNOSIS — G8929 Other chronic pain: Secondary | ICD-10-CM | POA: Diagnosis not present

## 2017-06-12 DIAGNOSIS — M439 Deforming dorsopathy, unspecified: Secondary | ICD-10-CM

## 2017-06-12 DIAGNOSIS — M5442 Lumbago with sciatica, left side: Principal | ICD-10-CM

## 2017-06-12 DIAGNOSIS — M419 Scoliosis, unspecified: Secondary | ICD-10-CM | POA: Diagnosis not present

## 2017-06-12 DIAGNOSIS — M5441 Lumbago with sciatica, right side: Secondary | ICD-10-CM | POA: Diagnosis not present

## 2017-06-12 DIAGNOSIS — M48061 Spinal stenosis, lumbar region without neurogenic claudication: Secondary | ICD-10-CM | POA: Diagnosis not present

## 2017-06-16 ENCOUNTER — Ambulatory Visit
Admission: RE | Admit: 2017-06-16 | Discharge: 2017-06-16 | Disposition: A | Discharge status: Home / Self Care | Payer: Medicare Other | Source: Ambulatory Visit | Attending: Nurse Practitioner | Admitting: Nurse Practitioner

## 2017-06-16 ENCOUNTER — Other Ambulatory Visit
Admission: RE | Admit: 2017-06-16 | Discharge: 2017-06-16 | Disposition: A | Discharge status: Home / Self Care | Payer: Medicare Other | Source: Ambulatory Visit | Attending: Nurse Practitioner | Admitting: Nurse Practitioner

## 2017-06-16 DIAGNOSIS — M48062 Spinal stenosis, lumbar region with neurogenic claudication: Secondary | ICD-10-CM

## 2017-06-16 DIAGNOSIS — M48061 Spinal stenosis, lumbar region without neurogenic claudication: Secondary | ICD-10-CM | POA: Diagnosis not present

## 2017-06-16 DIAGNOSIS — M439 Deforming dorsopathy, unspecified: Secondary | ICD-10-CM

## 2017-06-16 DIAGNOSIS — M419 Scoliosis, unspecified: Secondary | ICD-10-CM | POA: Insufficient documentation

## 2017-06-16 DIAGNOSIS — M5441 Lumbago with sciatica, right side: Secondary | ICD-10-CM | POA: Diagnosis not present

## 2017-06-16 DIAGNOSIS — G8929 Other chronic pain: Secondary | ICD-10-CM | POA: Insufficient documentation

## 2017-06-16 DIAGNOSIS — M4856XA Collapsed vertebra, not elsewhere classified, lumbar region, initial encounter for fracture: Secondary | ICD-10-CM | POA: Insufficient documentation

## 2017-06-16 DIAGNOSIS — M5442 Lumbago with sciatica, left side: Secondary | ICD-10-CM | POA: Diagnosis not present

## 2017-06-16 DIAGNOSIS — S32010A Wedge compression fracture of first lumbar vertebra, initial encounter for closed fracture: Secondary | ICD-10-CM | POA: Diagnosis not present

## 2017-06-16 LAB — BASIC METABOLIC PANEL
Anion gap: 8 (ref 5–15)
BUN: 20 mg/dL (ref 6–20)
CO2: 22 mmol/L (ref 22–32)
CREATININE: 0.72 mg/dL (ref 0.44–1.00)
Calcium: 8.5 mg/dL — ABNORMAL LOW (ref 8.9–10.3)
Chloride: 106 mmol/L (ref 101–111)
Glucose, Bld: 120 mg/dL — ABNORMAL HIGH (ref 65–99)
POTASSIUM: 3.7 mmol/L (ref 3.5–5.1)
SODIUM: 136 mmol/L (ref 135–145)

## 2017-06-16 MED ORDER — GADOBENATE DIMEGLUMINE 529 MG/ML IV SOLN
10.0000 mL | Freq: Once | INTRAVENOUS | Status: AC | PRN
  Administered 2017-06-16: 10 mL via INTRAVENOUS

## 2017-06-23 DIAGNOSIS — M47816 Spondylosis without myelopathy or radiculopathy, lumbar region: Secondary | ICD-10-CM | POA: Diagnosis not present

## 2017-06-23 DIAGNOSIS — M4856XA Collapsed vertebra, not elsewhere classified, lumbar region, initial encounter for fracture: Secondary | ICD-10-CM | POA: Diagnosis not present

## 2017-06-23 DIAGNOSIS — M5136 Other intervertebral disc degeneration, lumbar region: Secondary | ICD-10-CM | POA: Diagnosis not present

## 2017-06-23 DIAGNOSIS — M48061 Spinal stenosis, lumbar region without neurogenic claudication: Secondary | ICD-10-CM | POA: Diagnosis not present

## 2017-06-30 DIAGNOSIS — S32040A Wedge compression fracture of fourth lumbar vertebra, initial encounter for closed fracture: Secondary | ICD-10-CM | POA: Diagnosis not present

## 2017-09-11 DIAGNOSIS — M48061 Spinal stenosis, lumbar region without neurogenic claudication: Secondary | ICD-10-CM | POA: Diagnosis not present

## 2017-09-11 DIAGNOSIS — B0229 Other postherpetic nervous system involvement: Secondary | ICD-10-CM | POA: Diagnosis not present

## 2018-01-05 DIAGNOSIS — H2513 Age-related nuclear cataract, bilateral: Secondary | ICD-10-CM | POA: Diagnosis not present

## 2018-02-13 DIAGNOSIS — M48061 Spinal stenosis, lumbar region without neurogenic claudication: Secondary | ICD-10-CM | POA: Diagnosis not present

## 2018-02-13 DIAGNOSIS — R2689 Other abnormalities of gait and mobility: Secondary | ICD-10-CM | POA: Diagnosis not present

## 2018-02-13 DIAGNOSIS — B0229 Other postherpetic nervous system involvement: Secondary | ICD-10-CM | POA: Diagnosis not present

## 2018-02-27 DIAGNOSIS — H2512 Age-related nuclear cataract, left eye: Secondary | ICD-10-CM | POA: Diagnosis not present

## 2018-03-05 ENCOUNTER — Encounter: Payer: Self-pay | Admitting: *Deleted

## 2018-03-05 ENCOUNTER — Other Ambulatory Visit: Payer: Self-pay

## 2018-03-09 NOTE — Discharge Instructions (Signed)

## 2018-03-13 ENCOUNTER — Ambulatory Visit
Admission: RE | Admit: 2018-03-13 | Discharge: 2018-03-13 | Disposition: A | Discharge status: Home / Self Care | Payer: Medicare Other | Source: Ambulatory Visit | Attending: Ophthalmology | Admitting: Ophthalmology

## 2018-03-13 ENCOUNTER — Ambulatory Visit: Payer: Medicare Other | Admitting: Anesthesiology

## 2018-03-13 ENCOUNTER — Encounter
Admission: RE | Disposition: A | Discharge status: Home / Self Care | Payer: Self-pay | Source: Ambulatory Visit | Attending: Ophthalmology

## 2018-03-13 DIAGNOSIS — H25812 Combined forms of age-related cataract, left eye: Secondary | ICD-10-CM | POA: Diagnosis not present

## 2018-03-13 DIAGNOSIS — H2512 Age-related nuclear cataract, left eye: Secondary | ICD-10-CM | POA: Insufficient documentation

## 2018-03-13 HISTORY — DX: Zoster without complications: B02.9

## 2018-03-13 HISTORY — DX: Presence of dental prosthetic device (complete) (partial): Z97.2

## 2018-03-13 HISTORY — DX: Scoliosis, unspecified: M41.9

## 2018-03-13 HISTORY — DX: Restless legs syndrome: G25.81

## 2018-03-13 HISTORY — PX: CATARACT EXTRACTION W/PHACO: SHX586

## 2018-03-13 SURGERY — PHACOEMULSIFICATION, CATARACT, WITH IOL INSERTION
Anesthesia: Monitor Anesthesia Care | Site: Eye | Laterality: Left

## 2018-03-13 MED ORDER — LACTATED RINGERS IV SOLN: 10.0000 mL/h | INTRAVENOUS | Status: DC

## 2018-03-13 MED ORDER — ACETAMINOPHEN 325 MG PO TABS: 325.0000 mg | ORAL_TABLET | Freq: Once | ORAL | Status: DC

## 2018-03-13 MED ORDER — EPINEPHRINE PF 1 MG/ML IJ SOLN
INTRAOCULAR | Status: DC | PRN
  Administered 2018-03-13: 139 mL via OPHTHALMIC

## 2018-03-13 MED ORDER — TETRACAINE HCL 0.5 % OP SOLN
1.0000 [drp] | OPHTHALMIC | Status: DC | PRN
  Administered 2018-03-13 (×2): 1 [drp] via OPHTHALMIC

## 2018-03-13 MED ORDER — MIDAZOLAM HCL 2 MG/2ML IJ SOLN
INTRAMUSCULAR | Status: DC | PRN
  Administered 2018-03-13: 0.5 mg via INTRAVENOUS

## 2018-03-13 MED ORDER — MOXIFLOXACIN HCL 0.5 % OP SOLN
OPHTHALMIC | Status: DC | PRN
  Administered 2018-03-13: 0.2 mL via OPHTHALMIC

## 2018-03-13 MED ORDER — ARMC OPHTHALMIC DILATING DROPS
1.0000 "application " | OPHTHALMIC | Status: DC | PRN
  Administered 2018-03-13 (×3): 1 via OPHTHALMIC

## 2018-03-13 MED ORDER — SODIUM HYALURONATE 23 MG/ML IO SOLN
INTRAOCULAR | Status: DC | PRN
  Administered 2018-03-13: 0.6 mL via INTRAOCULAR

## 2018-03-13 MED ORDER — ACETAMINOPHEN 160 MG/5ML PO SOLN: 325.0000 mg | Freq: Once | ORAL | Status: DC

## 2018-03-13 MED ORDER — ONDANSETRON HCL 4 MG/2ML IJ SOLN
INTRAMUSCULAR | Status: DC | PRN
  Administered 2018-03-13: 4 mg via INTRAVENOUS

## 2018-03-13 MED ORDER — LIDOCAINE HCL (PF) 2 % IJ SOLN
INTRAOCULAR | Status: DC | PRN
  Administered 2018-03-13: 1.5 mL via INTRAOCULAR

## 2018-03-13 MED ORDER — SODIUM HYALURONATE 10 MG/ML IO SOLN
INTRAOCULAR | Status: DC | PRN
  Administered 2018-03-13: 0.55 mL via INTRAOCULAR

## 2018-03-13 SURGICAL SUPPLY — 18 items
CANNULA ANT/CHMB 27G (MISCELLANEOUS) ×1 IMPLANT
CANNULA ANT/CHMB 27GA (MISCELLANEOUS) ×3 IMPLANT
DISSECTOR HYDRO NUCLEUS 50X22 (MISCELLANEOUS) ×3 IMPLANT
GLOVE BIO SURGEON STRL SZ8 (GLOVE) ×3 IMPLANT
GLOVE SURG LX 7.5 STRW (GLOVE) ×2
GLOVE SURG LX STRL 7.5 STRW (GLOVE) ×1 IMPLANT
GOWN STRL REUS W/ TWL LRG LVL3 (GOWN DISPOSABLE) ×2 IMPLANT
GOWN STRL REUS W/TWL LRG LVL3 (GOWN DISPOSABLE) ×4
LENS IOL TECNIS ITEC 25.5 (Intraocular Lens) ×2 IMPLANT
MARKER SKIN DUAL TIP RULER LAB (MISCELLANEOUS) ×3 IMPLANT
PACK DR. KING ARMS (PACKS) ×3 IMPLANT
PACK EYE AFTER SURG (MISCELLANEOUS) ×3 IMPLANT
PACK OPTHALMIC (MISCELLANEOUS) ×3 IMPLANT
SPONGE SURG I SPEAR (MISCELLANEOUS) ×2 IMPLANT
SYR 3ML LL SCALE MARK (SYRINGE) ×3 IMPLANT
SYR TB 1ML LUER SLIP (SYRINGE) ×3 IMPLANT
WATER STERILE IRR 500ML POUR (IV SOLUTION) ×3 IMPLANT
WIPE NON LINTING 3.25X3.25 (MISCELLANEOUS) ×3 IMPLANT

## 2018-03-13 NOTE — Anesthesia Procedure Notes (Signed)
Procedure Name: MAC Performed by: Cerinity Zynda, CRNA Pre-anesthesia Checklist: Patient identified, Emergency Drugs available, Suction available, Timeout performed and Patient being monitored Patient Re-evaluated:Patient Re-evaluated prior to induction Oxygen Delivery Method: Nasal cannula Placement Confirmation: positive ETCO2       

## 2018-03-13 NOTE — Op Note (Signed)
OPERATIVE NOTE  Kirsten Reed [id number] 03/13/2018   PREOPERATIVE DIAGNOSIS:  Nuclear sclerotic cataract left eye.  H25.12   POSTOPERATIVE DIAGNOSIS:    Nuclear sclerotic cataract left eye.     PROCEDURE:  Phacoemusification with posterior chamber intraocular lens placement of the left eye   LENS:   Implant Name Type Inv. Item Serial No. Manufacturer Lot No. LRB No. Used  LENS IOL DIOP 25.5 - [id number] Intraocular Lens LENS IOL DIOP 25.5 [id number] AMO  Left 1       PCB00 +25.5   ULTRASOUND TIME: 1 minutes 15 seconds.  CDE 25.97   SURGEON:  Willey Blade, MD, MPH   ANESTHESIA:  Topical with tetracaine drops augmented with 1% preservative-free intracameral lidocaine.  ESTIMATED BLOOD LOSS: <1 mL   COMPLICATIONS:  None.   DESCRIPTION OF PROCEDURE:  The patient was identified in the holding room and transported to the operating room and placed in the supine position under the operating microscope.  The left eye was identified as the operative eye and it was prepped and draped in the usual sterile ophthalmic fashion.   A 1.0 millimeter clear-corneal paracentesis was made at the 5:00 position. 0.5 ml of preservative-free 1% lidocaine with epinephrine was injected into the anterior chamber.  The anterior chamber was filled with Healon 5 viscoelastic.  A 2.4 millimeter keratome was used to make a near-clear corneal incision at the 2:00 position.  A curvilinear capsulorrhexis was made with a cystotome and capsulorrhexis forceps.  Balanced salt solution was used to hydrodissect and hydrodelineate the nucleus.   Phacoemulsification was then used in stop and chop fashion to remove the lens nucleus and epinucleus.    The lens was very dense and required heavy phaco energy to remove despite many chopping maneuvers.  Additional healon 5 was placed 1/2 way through phaco.  The remaining cortex was then removed using the irrigation and aspiration handpiece. Healon was then placed into the  capsular bag to distend it for lens placement.  A lens was then injected into the capsular bag.  The remaining viscoelastic was aspirated.   Wounds were hydrated with balanced salt solution.  The anterior chamber was inflated to a physiologic pressure with balanced salt solution.  Intracameral vigamox 0.1 mL undiltued was injected into the eye and a drop placed onto the ocular surface.  No wound leaks were noted.  The patient was taken to the recovery room in stable condition without complications of anesthesia or surgery  Willey Blade 03/13/2018, 8:06 AM

## 2018-03-13 NOTE — Transfer of Care (Signed)
Immediate Anesthesia Transfer of Care Note  Patient: Kirsten Reed  Procedure(s) Performed: CATARACT EXTRACTION PHACO AND INTRAOCULAR LENS PLACEMENT (IOC) LEFT (Left Eye)  Patient Location: PACU  Anesthesia Type: MAC  Level of Consciousness: awake, alert  and patient cooperative  Airway and Oxygen Therapy: Patient Spontanous Breathing and Patient connected to supplemental oxygen  Post-op Assessment: Post-op Vital signs reviewed, Patient's Cardiovascular Status Stable, Respiratory Function Stable, Patent Airway and No signs of Nausea or vomiting  Post-op Vital Signs: Reviewed and stable  Complications: No apparent anesthesia complications

## 2018-03-13 NOTE — Anesthesia Postprocedure Evaluation (Signed)
Anesthesia Post Note  Patient: Kirsten Reed  Procedure(s) Performed: CATARACT EXTRACTION PHACO AND INTRAOCULAR LENS PLACEMENT ([place]) LEFT (Left Eye)  Patient location during evaluation: PACU Anesthesia Type: MAC Level of consciousness: awake and alert and oriented Pain management: satisfactory to patient Vital Signs Assessment: post-procedure vital signs reviewed and stable Respiratory status: spontaneous breathing, nonlabored ventilation and respiratory function stable Cardiovascular status: blood pressure returned to baseline and stable Postop Assessment: Adequate PO intake and No signs of nausea or vomiting Anesthetic complications: no    Raliegh Ip

## 2018-03-13 NOTE — H&P (Signed)
The History and Physical notes are on paper, have been signed, and are to be scanned.   I have examined the patient and there are no changes to the H&P.   Willey Blade 03/13/2018 7:24 AM

## 2018-03-13 NOTE — Anesthesia Preprocedure Evaluation (Signed)
Anesthesia Evaluation  Patient identified by MRN, date of birth, ID band Patient awake    Reviewed: Allergy & Precautions, H&P , NPO status , Patient's Chart, lab work & pertinent test results  Airway Mallampati: II  TM Distance: >3 FB Neck ROM: full    Dental no notable dental hx. (+) Upper Dentures   Pulmonary    Pulmonary exam normal breath sounds clear to auscultation       Cardiovascular Normal cardiovascular exam Rhythm:regular Rate:Normal     Neuro/Psych    GI/Hepatic   Endo/Other    Renal/GU      Musculoskeletal   Abdominal   Peds  Hematology   Anesthesia Other Findings   Reproductive/Obstetrics                             Anesthesia Physical Anesthesia Plan  ASA: II  Anesthesia Plan: MAC   Post-op Pain Management:    Induction:   PONV Risk Score and Plan: 2 and Midazolam and Treatment may vary due to age or medical condition  Airway Management Planned:   Additional Equipment:   Intra-op Plan:   Post-operative Plan:   Informed Consent: I have reviewed the patients History and Physical, chart, labs and discussed the procedure including the risks, benefits and alternatives for the proposed anesthesia with the patient or authorized representative who has indicated his/her understanding and acceptance.     Plan Discussed with: CRNA  Anesthesia Plan Comments:         Anesthesia Quick Evaluation

## 2018-03-14 ENCOUNTER — Encounter: Payer: Self-pay | Admitting: Ophthalmology

## 2018-05-11 DIAGNOSIS — H2511 Age-related nuclear cataract, right eye: Secondary | ICD-10-CM | POA: Diagnosis not present

## 2018-05-21 ENCOUNTER — Encounter: Payer: Self-pay | Admitting: *Deleted

## 2018-05-21 ENCOUNTER — Other Ambulatory Visit: Payer: Self-pay

## 2018-05-22 NOTE — Discharge Instructions (Signed)

## 2018-05-28 ENCOUNTER — Ambulatory Visit: Payer: Medicare Other | Admitting: Anesthesiology

## 2018-05-28 ENCOUNTER — Ambulatory Visit
Admission: RE | Admit: 2018-05-28 | Discharge: 2018-05-28 | Disposition: A | Discharge status: Home / Self Care | Payer: Medicare Other | Attending: Ophthalmology | Admitting: Ophthalmology

## 2018-05-28 ENCOUNTER — Encounter
Admission: RE | Disposition: A | Discharge status: Home / Self Care | Payer: Self-pay | Source: Home / Self Care | Attending: Ophthalmology

## 2018-05-28 DIAGNOSIS — H2511 Age-related nuclear cataract, right eye: Secondary | ICD-10-CM | POA: Insufficient documentation

## 2018-05-28 HISTORY — PX: CATARACT EXTRACTION W/PHACO: SHX586

## 2018-05-28 SURGERY — PHACOEMULSIFICATION, CATARACT, WITH IOL INSERTION
Anesthesia: Monitor Anesthesia Care | Laterality: Right

## 2018-05-28 MED ORDER — SODIUM HYALURONATE 10 MG/ML IO SOLN
INTRAOCULAR | Status: DC | PRN
  Administered 2018-05-28: 0.55 mL via INTRAOCULAR

## 2018-05-28 MED ORDER — ONDANSETRON HCL 4 MG/2ML IJ SOLN
INTRAMUSCULAR | Status: DC | PRN
  Administered 2018-05-28: 4 mg via INTRAVENOUS

## 2018-05-28 MED ORDER — EPINEPHRINE PF 1 MG/ML IJ SOLN
INTRAOCULAR | Status: DC | PRN
  Administered 2018-05-28: 138 mL via OPHTHALMIC

## 2018-05-28 MED ORDER — ACETAMINOPHEN 160 MG/5ML PO SOLN: 325.0000 mg | ORAL | Status: DC | PRN

## 2018-05-28 MED ORDER — SODIUM HYALURONATE 23 MG/ML IO SOLN
INTRAOCULAR | Status: DC | PRN
  Administered 2018-05-28: 0.6 mL via INTRAOCULAR

## 2018-05-28 MED ORDER — ARMC OPHTHALMIC DILATING DROPS
1.0000 "application " | OPHTHALMIC | Status: DC | PRN
  Administered 2018-05-28 (×3): 1 via OPHTHALMIC

## 2018-05-28 MED ORDER — LACTATED RINGERS IV SOLN: INTRAVENOUS | Status: DC

## 2018-05-28 MED ORDER — ACETAMINOPHEN 325 MG PO TABS: 325.0000 mg | ORAL_TABLET | ORAL | Status: DC | PRN

## 2018-05-28 MED ORDER — ONDANSETRON HCL 4 MG/2ML IJ SOLN: 4.0000 mg | Freq: Once | INTRAMUSCULAR | Status: DC | PRN

## 2018-05-28 MED ORDER — MOXIFLOXACIN HCL 0.5 % OP SOLN
OPHTHALMIC | Status: DC | PRN
  Administered 2018-05-28: 0.2 mL via OPHTHALMIC

## 2018-05-28 MED ORDER — TETRACAINE HCL 0.5 % OP SOLN
1.0000 [drp] | OPHTHALMIC | Status: DC | PRN
  Administered 2018-05-28 (×2): 1 [drp] via OPHTHALMIC

## 2018-05-28 MED ORDER — FENTANYL CITRATE (PF) 100 MCG/2ML IJ SOLN
INTRAMUSCULAR | Status: DC | PRN
  Administered 2018-05-28: 25 ug via INTRAVENOUS

## 2018-05-28 MED ORDER — MIDAZOLAM HCL 2 MG/2ML IJ SOLN
INTRAMUSCULAR | Status: DC | PRN
  Administered 2018-05-28: 0.5 mg via INTRAVENOUS

## 2018-05-28 MED ORDER — LIDOCAINE HCL (PF) 2 % IJ SOLN
INTRAOCULAR | Status: DC | PRN
  Administered 2018-05-28: 1 mL via INTRAOCULAR

## 2018-05-28 SURGICAL SUPPLY — 19 items
CANNULA ANT/CHMB 27G (MISCELLANEOUS) ×2 IMPLANT
CANNULA ANT/CHMB 27GA (MISCELLANEOUS) ×6 IMPLANT
DISSECTOR HYDRO NUCLEUS 50X22 (MISCELLANEOUS) ×3 IMPLANT
GLOVE SURG LX 7.5 STRW (GLOVE) ×2
GLOVE SURG LX STRL 7.5 STRW (GLOVE) ×1 IMPLANT
GLOVE SURG SYN 8.5  E (GLOVE) ×2
GLOVE SURG SYN 8.5 E (GLOVE) ×1 IMPLANT
GLOVE SURG SYN 8.5 PF PI (GLOVE) ×1 IMPLANT
GOWN STRL REUS W/ TWL LRG LVL3 (GOWN DISPOSABLE) ×2 IMPLANT
GOWN STRL REUS W/TWL LRG LVL3 (GOWN DISPOSABLE) ×4
LENS IOL TECNIS ITEC 24.5 (Intraocular Lens) ×2 IMPLANT
MARKER SKIN DUAL TIP RULER LAB (MISCELLANEOUS) ×3 IMPLANT
PACK DR. KING ARMS (PACKS) ×3 IMPLANT
PACK EYE AFTER SURG (MISCELLANEOUS) ×3 IMPLANT
PACK OPTHALMIC (MISCELLANEOUS) ×3 IMPLANT
SYR 3ML LL SCALE MARK (SYRINGE) ×3 IMPLANT
SYR TB 1ML LUER SLIP (SYRINGE) ×3 IMPLANT
WATER STERILE IRR 500ML POUR (IV SOLUTION) ×3 IMPLANT
WIPE NON LINTING 3.25X3.25 (MISCELLANEOUS) ×3 IMPLANT

## 2018-05-28 NOTE — Anesthesia Procedure Notes (Signed)
Procedure Name: MAC Date/Time: 05/28/2018 9:36 AM Performed by: Cameron Ali, CRNA Pre-anesthesia Checklist: Patient identified, Emergency Drugs available, Suction available, Timeout performed and Patient being monitored Patient Re-evaluated:Patient Re-evaluated prior to induction Oxygen Delivery Method: Nasal cannula Placement Confirmation: positive ETCO2

## 2018-05-28 NOTE — Op Note (Signed)
OPERATIVE NOTE  Kirsten Reed [id number] 05/28/2018   PREOPERATIVE DIAGNOSIS:  Nuclear sclerotic cataract right eye.  H25.11   POSTOPERATIVE DIAGNOSIS:    Nuclear sclerotic cataract right eye.     PROCEDURE:  Phacoemusification with posterior chamber intraocular lens placement of the right eye   LENS:   Implant Name Type Inv. Item Serial No. Manufacturer Lot No. LRB No. Used  LENS IOL DIOP 24.5 - [id number] Intraocular Lens LENS IOL DIOP 24.5 [id number] AMO  Right 1       PCB00 +24.5   ULTRASOUND TIME: 2 minutes 18 seconds.  CDE 28.69   SURGEON:  Willey Blade, MD, MPH  ANESTHESIOLOGIST: Anesthesiologist: Scarlette Slice, MD CRNA: Maree Krabbe, CRNA   ANESTHESIA:  Topical with tetracaine drops augmented with 1% preservative-free intracameral lidocaine.  ESTIMATED BLOOD LOSS: less than 1 mL.   COMPLICATIONS:  None.   DESCRIPTION OF PROCEDURE:  The patient was identified in the holding room and transported to the operating room and placed in the supine position under the operating microscope.  The right eye was identified as the operative eye and it was prepped and draped in the usual sterile ophthalmic fashion.   A 1.0 millimeter clear-corneal paracentesis was made at the 10:30 position. 0.5 ml of preservative-free 1% lidocaine with epinephrine was injected into the anterior chamber.  The anterior chamber was filled with Healon 5 viscoelastic.  A 2.4 millimeter keratome was used to make a near-clear corneal incision at the 8:00 position.  A curvilinear capsulorrhexis was made with a cystotome and capsulorrhexis forceps.  Balanced salt solution was used to hydrodissect and hydrodelineate the nucleus.   Phacoemulsification was then used in stop and chop fashion to remove the lens nucleus and epinucleus.    The lens was very dense and required significant phaco energy despite many chopping maneuvers.  The remaining cortex was then removed using the irrigation and aspiration  handpiece. Healon was then placed into the capsular bag to distend it for lens placement.  A lens was then injected into the capsular bag.  The remaining viscoelastic was aspirated.   Wounds were hydrated with balanced salt solution.  The anterior chamber was inflated to a physiologic pressure with balanced salt solution.   Intracameral vigamox 0.1 mL undiluted was injected into the eye and a drop placed onto the ocular surface.  No wound leaks were noted.  The patient was taken to the recovery room in stable condition without complications of anesthesia or surgery  Willey Blade 05/28/2018, 10:01 AM

## 2018-05-28 NOTE — Transfer of Care (Signed)
Immediate Anesthesia Transfer of Care Note  Patient: Kirsten Reed  Procedure(s) Performed: CATARACT EXTRACTION PHACO AND INTRAOCULAR LENS PLACEMENT (IOC) RIGHT (Right )  Patient Location: PACU  Anesthesia Type: MAC  Level of Consciousness: awake, alert  and patient cooperative  Airway and Oxygen Therapy: Patient Spontanous Breathing and Patient connected to supplemental oxygen  Post-op Assessment: Post-op Vital signs reviewed, Patient's Cardiovascular Status Stable, Respiratory Function Stable, Patent Airway and No signs of Nausea or vomiting  Post-op Vital Signs: Reviewed and stable  Complications: No apparent anesthesia complications

## 2018-05-28 NOTE — H&P (Signed)

## 2018-05-28 NOTE — Anesthesia Preprocedure Evaluation (Signed)
Anesthesia Evaluation  Patient identified by MRN, date of birth, ID band Patient awake    Reviewed: Allergy & Precautions, H&P , NPO status , Patient's Chart, lab work & pertinent test results  Airway Mallampati: II  TM Distance: >3 FB Neck ROM: full    Dental no notable dental hx. (+) Upper Dentures   Pulmonary    Pulmonary exam normal breath sounds clear to auscultation       Cardiovascular Normal cardiovascular exam Rhythm:regular Rate:Normal     Neuro/Psych    GI/Hepatic   Endo/Other    Renal/GU      Musculoskeletal   Abdominal   Peds  Hematology   Anesthesia Other Findings   Reproductive/Obstetrics                             Anesthesia Physical Anesthesia Plan  ASA: II  Anesthesia Plan: MAC   Post-op Pain Management:    Induction:   PONV Risk Score and Plan: 2 and Midazolam and Treatment may vary due to age or medical condition  Airway Management Planned:   Additional Equipment:   Intra-op Plan:   Post-operative Plan:   Informed Consent: I have reviewed the patients History and Physical, chart, labs and discussed the procedure including the risks, benefits and alternatives for the proposed anesthesia with the patient or authorized representative who has indicated his/her understanding and acceptance.     Plan Discussed with: CRNA  Anesthesia Plan Comments:         Anesthesia Quick Evaluation  

## 2018-05-28 NOTE — Anesthesia Postprocedure Evaluation (Signed)
Anesthesia Post Note  Patient: Kirsten Reed  Procedure(s) Performed: CATARACT EXTRACTION PHACO AND INTRAOCULAR LENS PLACEMENT (IOC) RIGHT (Right )  Patient location during evaluation: PACU Anesthesia Type: MAC Level of consciousness: awake and alert Pain management: pain level controlled Vital Signs Assessment: post-procedure vital signs reviewed and stable Respiratory status: spontaneous breathing, nonlabored ventilation, respiratory function stable and patient connected to nasal cannula oxygen Cardiovascular status: stable and blood pressure returned to baseline Postop Assessment: no apparent nausea or vomiting Anesthetic complications: no    Alisa Graff

## 2018-05-29 ENCOUNTER — Encounter: Payer: Self-pay | Admitting: Ophthalmology

## 2019-01-05 ENCOUNTER — Encounter: Payer: Self-pay | Admitting: Emergency Medicine

## 2019-01-05 ENCOUNTER — Ambulatory Visit: Payer: Medicare Other

## 2019-01-05 ENCOUNTER — Other Ambulatory Visit: Payer: Self-pay

## 2019-01-05 ENCOUNTER — Ambulatory Visit
Admission: EM | Admit: 2019-01-05 | Discharge: 2019-01-05 | Disposition: A | Discharge status: Home / Self Care | Payer: Medicare Other | Attending: Family Medicine | Admitting: Family Medicine

## 2019-01-05 DIAGNOSIS — I491 Atrial premature depolarization: Secondary | ICD-10-CM | POA: Insufficient documentation

## 2019-01-05 DIAGNOSIS — R002 Palpitations: Secondary | ICD-10-CM | POA: Diagnosis present

## 2019-01-05 DIAGNOSIS — R0602 Shortness of breath: Secondary | ICD-10-CM | POA: Insufficient documentation

## 2019-01-05 LAB — BASIC METABOLIC PANEL
Anion gap: 10 (ref 5–15)
BUN: 10 mg/dL (ref 8–23)
CO2: 21 mmol/L — ABNORMAL LOW (ref 22–32)
Calcium: 8.9 mg/dL (ref 8.9–10.3)
Chloride: 106 mmol/L (ref 98–111)
Creatinine, Ser: 0.56 mg/dL (ref 0.44–1.00)
GFR calc Af Amer: >60 mL/min (ref >60)
GFR calc non Af Amer: >60 mL/min (ref >60)
Glucose, Bld: 101 mg/dL — ABNORMAL HIGH (ref 70–99)
Potassium: 4.3 mmol/L (ref 3.5–5.1)
Sodium: 137 mmol/L (ref 135–145)

## 2019-01-05 LAB — MAGNESIUM: Magnesium: 1.9 mg/dL (ref 1.7–2.4)

## 2019-01-05 NOTE — Discharge Instructions (Addendum)
Call/follow up with your Primary Care Provider on Monday for possible cardiology referral  Go to [facility] Emergency Department if symptoms worsen

## 2019-01-05 NOTE — ED Provider Notes (Signed)
[facility]    CSN: [id number] Arrival date & time: 01/05/19  0806     History   Chief Complaint Chief Complaint  Patient presents with  . Shortness of Breath  . Palpitations    HPI Kirsten Reed is a 85 y.o. female.   85 yo female who states she's generally healthy and takes no medications presents with a c/o shortness of breath and palpitations on and off since yesterday. She states that she's had this in the past, however it has resolved spontaneously more quickly. Denies any chest pain, arm pain, jaw pain, fevers, chills, cough, syncope, leg swelling, wheezing.      Past Medical History:  Diagnosis Date  . Broken wrist   . Restless leg syndrome   . Scoliosis   . Shingles 05/2016  . Wears dentures    full upper    There are no active problems to display for this patient.   Past Surgical History:  Procedure Laterality Date  . CATARACT EXTRACTION W/PHACO Left 03/13/2018   Procedure: CATARACT EXTRACTION PHACO AND INTRAOCULAR LENS PLACEMENT ([place]) LEFT;  Surgeon: Eulogio Bear, MD;  Location: [facility];  Service: Ophthalmology;  Laterality: Left;  . CATARACT EXTRACTION W/PHACO Right 05/28/2018   Procedure: CATARACT EXTRACTION PHACO AND INTRAOCULAR LENS PLACEMENT ([place]) RIGHT;  Surgeon: Eulogio Bear, MD;  Location: [facility];  Service: Ophthalmology;  Laterality: Right;    OB History   No obstetric history on file.      Home Medications    Prior to Admission medications   Not on File    Family History Family History  Problem Relation Age of Onset  . Hypertension Mother   . Diabetes Mother     Social History Social History   Tobacco Use  . Smoking status: Never Smoker  . Smokeless tobacco: Never Used  Substance Use Topics  . Alcohol use: No    Alcohol/week: 0.0 standard drinks  . Drug use: No     Allergies   Sulfa antibiotics   Review of Systems Review of Systems   Physical Exam Triage Vital  Signs ED Triage Vitals  Enc Vitals Group     BP 01/05/19 0812 (S) (!) 163/83     Pulse Rate 01/05/19 0812 100     Resp 01/05/19 0812 14     Temp 01/05/19 0812 98.2 F (36.8 C)     Temp Source 01/05/19 0812 Oral     SpO2 01/05/19 0812 98 %     Weight 01/05/19 0808 119 lb (54 kg)     Height 01/05/19 0808 4\' 11"  (1.499 m)     Head Circumference --      Peak Flow --      Pain Score 01/05/19 0808 0     Pain Loc --      Pain Edu? --      Excl. in [place]? --    No data found.  Updated Vital Signs BP 140/70 (BP Location: Right Arm)   Pulse 100   Temp 98.2 F (36.8 C) (Oral)   Resp 14   Ht 4\' 11"  (1.499 m)   Wt 54 kg   SpO2 98%   BMI 24.04 kg/m   Visual Acuity Right Eye Distance:   Left Eye Distance:   Bilateral Distance:    Right Eye Near:   Left Eye Near:    Bilateral Near:     Physical Exam Vitals signs and nursing note reviewed.  Constitutional:  General: She is not in acute distress.    Appearance: She is not toxic-appearing or diaphoretic.  Cardiovascular:     Rate and Rhythm: Normal rate and regular rhythm.     Pulses: Normal pulses.  Pulmonary:     Effort: Pulmonary effort is normal. No respiratory distress.     Breath sounds: Normal breath sounds. No stridor. No wheezing, rhonchi or rales.  Musculoskeletal:     Right lower leg: No edema.     Left lower leg: No edema.  Neurological:     Mental Status: She is alert.      UC Treatments / Results  Labs (all labs ordered are listed, but only abnormal results are displayed) Labs Reviewed  BASIC METABOLIC PANEL - Abnormal; Notable for the following components:      Result Value   CO2 21 (*)    Glucose, Bld 101 (*)    All other components within normal limits  MAGNESIUM    EKG   Radiology Dg Chest 2 View  Result Date: 01/05/2019 CLINICAL DATA:  Shortness of breath.  Heart palpitations. EXAM: CHEST - 2 VIEW COMPARISON:  05/08/2007 and thoracic spine study 06/03/2017 FINDINGS: Lungs are clear. No  pleural effusions. Atherosclerotic calcifications in the thoracic aorta. Heart size is normal. There is some dextroscoliosis of the thoracic spine. Compression deformity in the mid/lower thoracic spine probably represents interval height loss involving the T9 vertebral body based on the comparison from 2019. Deformity of the proximal left humerus and question old fracture. There is also sclerosis and deformity of the left scapula compatible with prior fracture. IMPRESSION: 1. No acute cardiopulmonary disease. 2. Compression deformity in the mid/lower thoracic spine. This may represent progressive height loss involving a known fracture. Limited evaluation of the thoracic spine. 3. Probable posttraumatic changes involving the left scapula and proximal left humerus of unknown age. Electronically Signed   By: Richarda OverlieAdam  Henn M.D.   On: 01/05/2019 09:44    Procedures ED EKG  Date/Time: 01/05/2019 1:35 PM Performed by: Payton Mccallumonty, Iana Buzan, MD Authorized by: Payton Mccallumonty, Raekwon Winkowski, MD   ECG reviewed by ED Physician in the absence of a cardiologist: yes   Previous ECG:    Previous ECG:  Compared to current   Comparison ECG info:  2008-normal sinus rhythm   Similarity:  Changes noted Interpretation:    Interpretation: normal   Rate:    ECG rate:  99   ECG rate assessment: normal   Rhythm:    Rhythm: sinus rhythm   Ectopy:    Ectopy: PAC   QRS:    QRS axis:  Normal   QRS intervals:  Normal Conduction:    Conduction: normal   ST segments:    ST segments:  Normal T waves:    T waves: normal     (including critical care time)  Medications Ordered in UC Medications - No data to display  Initial Impression / Assessment and Plan / UC Course  I have reviewed the triage vital signs and the nursing notes.  Pertinent labs & imaging results that were available during my care of the patient were reviewed by me and considered in my medical decision making (see chart for details).     Final Clinical  Impressions(s) / UC Diagnoses   Final diagnoses:  Palpitations  Shortness of breath  Premature atrial contractions     Discharge Instructions     Call/follow up with your Primary Care Provider on Monday for possible cardiology referral  [facility] if symptoms worsen    ED Prescriptions    None      1. Labs/chest x-ray result (negative for acute abnormalities) and diagnosis reviewed with patient and husband 2.  Recommend follow up with PCP on Monday for further evaluation/management/possible cardiology referral 3. If symptoms worsen over the rest of the weekend then go to Emergency Department (discussed/explained to patient and husband) 4. Follow-up prn  Controlled Substance Prescriptions [place] Controlled Substance Registry consulted? Not Applicable   Payton Mccallumonty, Lanson Randle, MD 01/05/19 1336

## 2019-01-05 NOTE — ED Triage Notes (Addendum)
Patient c/o SOB and heart palpitation that started yesterday.  Patient reports history of palpitations before and would go away.  Patient denies chest pain.

## 2019-01-21 ENCOUNTER — Encounter: Payer: Self-pay | Admitting: Nurse Practitioner

## 2019-01-22 ENCOUNTER — Other Ambulatory Visit: Payer: Self-pay

## 2019-01-22 NOTE — Progress Notes (Signed)
[facility]  [address], [address] [place], [place] [address] Phone: [phone]  Fax: [phone]   Clinic Day:  01/23/2019  Referring physician: Myrene Buddy, *  Chief Complaint: Kirsten Reed is a 85 y.o. female with iron deficiency anemia who is referred in consultation by Lenon Oms, NP for assessment and management.   HPI:   The patient states that she had no underlying medical problems until about 2 years ago when she hurt her back then had shingles.  She was in bed for a while as she could not walk.    She then describes developing palpitations and shortness of breath.  She presented to the [facility] on 01/05/2019 for evaluation.  Creatinine was 0.56.  CXR was negative.  She was discharged to follow-up with her PCP.   Labs followed: 05/21/2016: hematocrit 26.3, hemoglobin 8.3, MCV 71.2, platelets 393,000, WBC 8,700.  01/16/2019: hematocrit 26.2, hemoglobin 7.2, MCV 68.3, platelets 386,000, WBC 6,000 (ANC 3500).  Iron saturation 4% with a TIBC 471.5.  TSH 1.723.  She has been on ferrous sulfate 325 mg po BID x 1 week.  Symptomatically, she describes herself as energetic and busy.  She denies any fevers, sweats or weight loss.  She has intermittent shortness of breath.  She has never had a colonoscopy.  She denies any melena, hematochezia, hematuria or vaginal bleeding.  She denies any family history of malignancy or blood disorder.   Past Medical History:  Diagnosis Date  . Broken wrist   . Restless leg syndrome   . Scoliosis   . Shingles 05/2016  . Wears dentures    full upper    Past Surgical History:  Procedure Laterality Date  . CATARACT EXTRACTION W/PHACO Left 03/13/2018   Procedure: CATARACT EXTRACTION PHACO AND INTRAOCULAR LENS PLACEMENT ([facility]) LEFT;  Surgeon: [place] Crane, MD;  Location: [facility];  Service: Ophthalmology;  Laterality: Left;  . CATARACT EXTRACTION W/PHACO Right 05/28/2018   Procedure: CATARACT EXTRACTION PHACO AND INTRAOCULAR LENS PLACEMENT ([facility]) RIGHT;  Surgeon: Nevada Crane, MD;  Location: [facility];  Service: Ophthalmology;  Laterality: Right;    Family History  Problem Relation Age of Onset  . Hypertension Mother   . Diabetes Mother     Social History:  reports that she has never smoked. She has never used smokeless tobacco. She reports that she does not drink alcohol or use drugs.  She is retired.  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  Her husband's name is Therapist, nutritional.  She lives in [place].  The patient is alone today.  Allergies:  Allergies  Allergen Reactions  . Sulfa Antibiotics     Current Medications: Current Outpatient Medications  Medication Sig Dispense Refill  . ferrous sulfate 325 (65 FE) MG tablet TAKE 1 TABLET (325 MG TOTAL) BY MOUTH 2 (TWO) TIMES DAILY WITH MEALS     No current facility-administered medications for this visit.     Review of Systems  Constitutional: Negative.  Negative for chills, diaphoresis, fever, malaise/fatigue and weight loss.       "Energetic and busy".  HENT: Negative.  Negative for congestion, hearing loss, sinus pain and sore throat.   Eyes: Negative.  Negative for blurred vision, double vision, photophobia and pain.       S/p cataract surgery 04/2018 and 05/2018.  Respiratory: Positive for shortness of breath (comes and goes). Negative for cough, hemoptysis, sputum production and wheezing.   Cardiovascular: Negative.  Negative for  chest pain, palpitations, orthopnea, leg swelling and PND.  Gastrointestinal: Negative.  Negative for abdominal pain, blood in stool, constipation, diarrhea, melena, nausea and vomiting.       She eats well.  No prior colonoscopy.  Genitourinary: Negative.  Negative for dysuria, frequency, hematuria and urgency.       No vaginal bleeding.  Musculoskeletal: Negative.  Negative for back pain, joint pain and myalgias.       Distant history of left  shoulder and wrist fracture after falling off of chair.  Skin: Negative.  Negative for rash.  Neurological: Negative.  Negative for dizziness, tingling, sensory change, weakness and headaches.  Endo/Heme/Allergies: Negative.  Does not bruise/bleed easily.  Psychiatric/Behavioral: Negative.  Negative for depression, memory loss and substance abuse. The patient is not nervous/anxious and does not have insomnia.   All other systems reviewed and are negative.  Performance status (ECOG): 0-1  Vitals Blood pressure (!) 127/59, pulse 98, temperature 98.3 F (36.8 C), temperature source Tympanic, resp. rate 18, height 4\' 11"  (1.499 m), weight 118 lb 9.7 oz (53.8 kg), SpO2 99 %.   Physical Exam  Constitutional: She is oriented to person, place, and time. She appears well-developed and well-nourished. No distress.  HENT:  Head: Normocephalic and atraumatic.  Mouth/Throat: Oropharynx is clear and moist. No oropharyngeal exudate.  Short white hair.  Upper dentures.  Mask.  Eyes: Pupils are equal, round, and reactive to light. Conjunctivae and EOM are normal. No scleral icterus.  Glasses.  Blue s/p cataract surgery.  Neck: Normal range of motion. Neck supple. No JVD present.  Cardiovascular: Normal rate, regular rhythm and normal heart sounds.  No murmur heard. Pulmonary/Chest: Effort normal and breath sounds normal. No respiratory distress. She has no wheezes. She has no rales.  Abdominal: Soft. Bowel sounds are normal. She exhibits no distension and no mass. There is no abdominal tenderness. There is no rebound and no guarding.  Musculoskeletal: Normal range of motion.        General: No tenderness or edema.  Lymphadenopathy:    She has no cervical adenopathy.    She has no axillary adenopathy.       Right: No supraclavicular adenopathy present.       Left: No supraclavicular adenopathy present.  Neurological: She is alert and oriented to person, place, and time.  Skin: Skin is warm and dry.  No rash noted. She is not diaphoretic. No erythema.  Psychiatric: She has a normal mood and affect. Her behavior is normal. Judgment and thought content normal.  Nursing note and vitals reviewed.   No visits with results within 3 Day(s) from this visit.  Latest known visit with results is:  Admission on 01/05/2019, Discharged on 01/05/2019  Component Date Value Ref Range Status  . Sodium 01/05/2019 137  135 - 145 mmol/L Final  . Potassium 01/05/2019 4.3  3.5 - 5.1 mmol/L Final  . Chloride 01/05/2019 106  98 - 111 mmol/L Final  . CO2 01/05/2019 21* 22 - 32 mmol/L Final  . Glucose, Bld 01/05/2019 101* 70 - 99 mg/dL Final  . BUN 40/98/119108/15/2020 10  8 - 23 mg/dL Final  . Creatinine, Ser 01/05/2019 0.56  0.44 - 1.00 mg/dL Final  . Calcium 47/82/956208/15/2020 8.9  8.9 - 10.3 mg/dL Final  . GFR calc non Af Amer 01/05/2019 >60  >60 mL/min Final  . GFR calc Af Amer 01/05/2019 >60  >60 mL/min Final  . Anion gap 01/05/2019 10  5 - 15 Final   Performed  at [facility], [address] [place], [place] [address]  . Magnesium 01/05/2019 1.9  1.7 - 2.4 mg/dL Final   Performed at [facility], [address], [place], [place] [address]    Assessment:  TENEKA MALMBERG is a 85 y.o. female with iron deficiency anemia.  She has had a microcytic anemia dating back to at least 04/2016.  Hemoglobin has ranged between 7.2-8.3.  Iron saturation was 4% on 01/16/2019.  Diet appears good.  She does not eat red meat.    Labs on 01/16/2019:  hematocrit 26.2, hemoglobin 7.2, MCV 68.3, platelets 386,000, WBC 6,000 (ANC 3500).  Iron saturation 4% with a TIBC 471.5.  TSH 1.723.  She has never had a colonoscopy.  Symptomatically, she denies any melena, hematochezia, hematuria or vaginal bleeding.  Exam is unremarkable.  Plan: 1. Labs today:  CBC with diff, hold tube, retic, ferritin, B12, folate. 2. Urinalysis r/o hematuria. 3. Guaiac cards x 3. 4. Iron deficiency anemia  Hemoglobin 7.2.  MCV 68.3.   Iron saturation 4%.  Patient has been on ferrous sulfate 325 mg BID x 1 week.  Discuss taking oral iron with OJ or vitamin C.  Discuss iron rich foods.  Discuss IV iron if unable to replete iron stores on oral iron.  Preauth Venofer. 5.   RTC in 2 weeks for labs (CBC, ferritin, sed rate- day before) +/- Venofer.  I discussed the assessment and treatment plan with the patient.  The patient was provided an opportunity to ask questions and all were answered.  The patient agreed with the plan and demonstrated an understanding of the instructions.  The patient was advised to call back if the symptoms worsen or if the condition fails to improve as anticipated.   Melissa C. Mike Gip, MD, PhD    01/23/2019, 1:28 PM

## 2019-01-23 ENCOUNTER — Inpatient Hospital Stay: Payer: Medicare Other

## 2019-01-23 ENCOUNTER — Encounter: Payer: Self-pay | Admitting: Hematology and Oncology

## 2019-01-23 ENCOUNTER — Inpatient Hospital Stay: Payer: Medicare Other | Attending: Hematology and Oncology | Admitting: Hematology and Oncology

## 2019-01-23 VITALS — BP 127/59 | HR 98 | Temp 98.3°F | Resp 18 | Ht 59.0 in | Wt 118.6 lb

## 2019-01-23 DIAGNOSIS — D509 Iron deficiency anemia, unspecified: Secondary | ICD-10-CM | POA: Insufficient documentation

## 2019-01-23 DIAGNOSIS — Z8249 Family history of ischemic heart disease and other diseases of the circulatory system: Secondary | ICD-10-CM | POA: Insufficient documentation

## 2019-01-23 DIAGNOSIS — R0602 Shortness of breath: Secondary | ICD-10-CM | POA: Diagnosis not present

## 2019-01-23 DIAGNOSIS — Z833 Family history of diabetes mellitus: Secondary | ICD-10-CM | POA: Diagnosis not present

## 2019-01-23 DIAGNOSIS — D649 Anemia, unspecified: Secondary | ICD-10-CM

## 2019-01-23 LAB — CBC WITH DIFFERENTIAL/PLATELET
Abs Immature Granulocytes: 0.04 10*3/uL (ref 0.00–0.07)
Basophils Absolute: 0.1 10*3/uL (ref 0.0–0.1)
Basophils Relative: 1 %
Eosinophils Absolute: 0.1 10*3/uL (ref 0.0–0.5)
Eosinophils Relative: 3 %
HCT: 26 % — ABNORMAL LOW (ref 36.0–46.0)
Hemoglobin: 7.5 g/dL — ABNORMAL LOW (ref 12.0–15.0)
Immature Granulocytes: 1 %
Lymphocytes Relative: 23 %
Lymphs Abs: 1.3 10*3/uL (ref 0.7–4.0)
MCH: 19.9 pg — ABNORMAL LOW (ref 26.0–34.0)
MCHC: 28.8 g/dL — ABNORMAL LOW (ref 30.0–36.0)
MCV: 69.1 fL — ABNORMAL LOW (ref 80.0–100.0)
Monocytes Absolute: 0.8 10*3/uL (ref 0.1–1.0)
Monocytes Relative: 15 %
Neutro Abs: 3.1 10*3/uL (ref 1.7–7.7)
Neutrophils Relative %: 57 %
Platelets: 396 10*3/uL (ref 150–400)
RBC: 3.76 MIL/uL — ABNORMAL LOW (ref 3.87–5.11)
RDW: 23.7 % — ABNORMAL HIGH (ref 11.5–15.5)
WBC: 5.4 10*3/uL (ref 4.0–10.5)
nRBC: 0 % (ref 0.0–0.2)

## 2019-01-23 LAB — RETICULOCYTES
Immature Retic Fract: 37.2 % — ABNORMAL HIGH (ref 2.3–15.9)
RBC.: 3.8 MIL/uL — ABNORMAL LOW (ref 3.87–5.11)
Retic Count, Absolute: 125.8 10*3/uL (ref 19.0–186.0)
Retic Ct Pct: 3.3 % — ABNORMAL HIGH (ref 0.4–3.1)

## 2019-01-23 LAB — URINALYSIS, COMPLETE (UACMP) WITH MICROSCOPIC
Bacteria, UA: NONE SEEN
Bilirubin Urine: NEGATIVE
Glucose, UA: NEGATIVE mg/dL
Hgb urine dipstick: NEGATIVE
Ketones, ur: NEGATIVE mg/dL
Nitrite: NEGATIVE
Protein, ur: NEGATIVE mg/dL
RBC / HPF: NONE SEEN RBC/hpf (ref 0–5)
Specific Gravity, Urine: 1.025 (ref 1.005–1.030)
pH: 5.5 (ref 5.0–8.0)

## 2019-01-23 LAB — SAMPLE TO BLOOD BANK

## 2019-01-23 LAB — FERRITIN: Ferritin: 90 ng/mL (ref 11–307)

## 2019-01-23 LAB — FOLATE: Folate: 27 ng/mL (ref >5.9)

## 2019-01-23 LAB — VITAMIN B12: Vitamin B-12: 235 pg/mL (ref 180–914)

## 2019-01-24 ENCOUNTER — Other Ambulatory Visit: Payer: Self-pay

## 2019-01-24 DIAGNOSIS — D509 Iron deficiency anemia, unspecified: Secondary | ICD-10-CM | POA: Diagnosis not present

## 2019-01-24 DIAGNOSIS — D649 Anemia, unspecified: Secondary | ICD-10-CM

## 2019-01-24 LAB — IRON AND TIBC
Iron: 58 ug/dL (ref 28–170)
Saturation Ratios: 14 % (ref 10.4–31.8)
TIBC: 411 ug/dL (ref 250–450)
UIBC: 353 ug/dL

## 2019-01-24 LAB — PATHOLOGIST SMEAR REVIEW

## 2019-01-24 LAB — LACTATE DEHYDROGENASE: LDH: 147 U/L (ref 98–192)

## 2019-01-25 ENCOUNTER — Telehealth: Payer: Self-pay

## 2019-01-25 ENCOUNTER — Other Ambulatory Visit: Payer: Self-pay

## 2019-01-25 DIAGNOSIS — D509 Iron deficiency anemia, unspecified: Secondary | ICD-10-CM | POA: Diagnosis not present

## 2019-01-25 DIAGNOSIS — D649 Anemia, unspecified: Secondary | ICD-10-CM

## 2019-01-25 LAB — OCCULT BLOOD X 1 CARD TO LAB, STOOL
Fecal Occult Bld: NEGATIVE
Fecal Occult Bld: NEGATIVE
Fecal Occult Bld: NEGATIVE

## 2019-01-25 NOTE — Telephone Encounter (Signed)
Left a message to inform the patient about her lab results B- 12 is low, per Dr Mike Gip began B-12 1000 mcg 1 tab po daily and recheck labs in 1 month and if they are still low at that time, will begin B-12 injection.

## 2019-01-30 ENCOUNTER — Other Ambulatory Visit: Payer: Self-pay | Admitting: Hematology and Oncology

## 2019-01-31 NOTE — Progress Notes (Signed)
[facility]  [address], [address] [place], [place] [address] Phone: [phone]  Fax: [phone]   Clinic Day:  01/31/2019  Referring physician: Myrene Buddy, *  Chief Complaint: Kirsten Reed is a 85 y.o. female with iron deficiency anemia who is seen for review of work-up and discussion regarding direction of therapy.  HPI: The patient was last seen in the hematology clinic for initial consultation on 01/23/2019. At that time, she denied any melena, hematochezia, hematuria or vaginal bleeding. Exam was unremarkable. Hemoglobin 7.2. MCV 68.3. Iron saturation 4%. Patient had been on ferrous sulfate 325 mg BID x 1 week. We dicussed iron rich food and oral iron with OJ or vitamin C.   Work-up revealed a hematocrit of 26.0, hemoglobin 7.5, MCV 69.1, platelets 396,000, WBC 5400 with an ANC of 3100.  Retic was 3.3%.  Ferritin was 90 with an iron saturation of 14% with a TIBC of 411.  LDH was 147.  B12 was 235 (low).  Peripheral smear revealed microcytic hypochromic anemia with mild anisopoikilocytosis including rare target cells.  There was mild polychromasia, normal leukocyte count and differential and mildly increased platelet count with unremarkable morphology.  Findings were suggestive of iron deficiency anemia.  Urinalysis revealed no hematuria.  Guaiac cards x 4 were negative.  She was advised to take B12 1000 mcg daily on 01/25/2019.   Labs on 02/04/2019 included a hematocrit of 30.8, hemoglobin 9.0, MCV 76.2, platelets 322,000, white count 5900 with an ANC of 3400.  Ferritin was 41.  Sed rate was 30.  During the interim, the patient felt "ok". She is taking x 3 iron pills daily. She is tolerating her iron and B-12 pills. She notes a good energy level. She reports being "busy and on the go" all the time. She is eating well.    Past Medical History:  Diagnosis Date  . Broken wrist   . Restless leg syndrome   . Scoliosis   . Shingles 05/2016  .  Wears dentures    full upper    Past Surgical History:  Procedure Laterality Date  . CATARACT EXTRACTION W/PHACO Left 03/13/2018   Procedure: CATARACT EXTRACTION PHACO AND INTRAOCULAR LENS PLACEMENT (IOC) LEFT;  Surgeon: Nevada Crane, MD;  Location: [facility];  Service: Ophthalmology;  Laterality: Left;  . CATARACT EXTRACTION W/PHACO Right 05/28/2018   Procedure: CATARACT EXTRACTION PHACO AND INTRAOCULAR LENS PLACEMENT (IOC) RIGHT;  Surgeon: Nevada Crane, MD;  Location: [facility];  Service: Ophthalmology;  Laterality: Right;    Family History  Problem Relation Age of Onset  . Hypertension Mother   . Diabetes Mother     Social History:  reports that she has never smoked. She has never used smokeless tobacco. She reports that she does not drink alcohol or use drugs. She is retired.  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  Her husband's name is Therapist, nutritional.  She lives in [place]. The patient is alone today.  Allergies:  Allergies  Allergen Reactions  . Sulfa Antibiotics     Current Medications: Current Outpatient Medications  Medication Sig Dispense Refill  . ferrous sulfate 325 (65 FE) MG tablet TAKE 1 TABLET (325 MG TOTAL) BY MOUTH 2 (TWO) TIMES DAILY WITH MEALS     No current facility-administered medications for this visit.     Review of Systems  Constitutional: Negative.  Negative for chills, diaphoresis, fever, malaise/fatigue and weight loss (stable).       Feels "ok".  Good energy.  HENT: Negative.  Negative for congestion, hearing loss, sinus pain and sore throat.   Eyes: Negative.  Negative for blurred vision, double vision, photophobia and pain.       S/p cataract surgery 04/2018 and 05/2018.  Respiratory: Negative.  Negative for cough, hemoptysis, sputum production, shortness of breath (comes and goes) and wheezing.   Cardiovascular: Negative.  Negative for chest pain, palpitations, orthopnea, leg swelling and PND.   Gastrointestinal: Negative.  Negative for abdominal pain, blood in stool, constipation, diarrhea, melena, nausea and vomiting.       Eating well.  No prior colonoscopy.  Genitourinary: Negative.  Negative for dysuria, frequency, hematuria and urgency.       No vaginal bleeding.  Musculoskeletal: Negative.  Negative for back pain, joint pain and myalgias.       Distant history of left shoulder and wrist fracture after falling off of chair.  Skin: Negative.  Negative for rash.  Neurological: Negative.  Negative for dizziness, tingling, sensory change, focal weakness, weakness and headaches.  Endo/Heme/Allergies: Negative.  Does not bruise/bleed easily.  Psychiatric/Behavioral: Negative.  Negative for depression and memory loss. The patient is not nervous/anxious and does not have insomnia.   All other systems reviewed and are negative.  Performance status (ECOG): 0  Vitals Blood pressure (!) 150/68, pulse 89, temperature 97.9 F (36.6 C), temperature source Tympanic, resp. rate 18, height 4\' 11"  (1.499 m), weight 118 lb 11.5 oz (53.8 kg), SpO2 100 %.  Physical Exam  Constitutional: She is oriented to person, place, and time. She appears well-developed and well-nourished. No distress.  HENT:  Head: Normocephalic and atraumatic.  Mouth/Throat: Oropharynx is clear and moist. No oropharyngeal exudate.  Short gray hair.  Upper dentures.  Mask.  Eyes: Pupils are equal, round, and reactive to light. Conjunctivae and EOM are normal. No scleral icterus.  Glasses.  Brown eyes with arcus s/p cataract surgery.  Neck: Normal range of motion. Neck supple. No JVD present.  Cardiovascular: Normal rate, regular rhythm and normal heart sounds.  No murmur heard. Pulmonary/Chest: Effort normal and breath sounds normal. No respiratory distress. She has no wheezes. She has no rales.  Abdominal: Soft. Bowel sounds are normal. She exhibits no distension and no mass. There is no abdominal tenderness. There is no  rebound and no guarding.  Musculoskeletal: Normal range of motion.        General: No tenderness or edema.  Lymphadenopathy:    She has no cervical adenopathy.    She has no axillary adenopathy.       Right: No supraclavicular adenopathy present.       Left: No supraclavicular adenopathy present.  Neurological: She is alert and oriented to person, place, and time.  Skin: Skin is warm and dry. No rash noted. She is not diaphoretic. No erythema. No pallor.  Psychiatric: She has a normal mood and affect. Her behavior is normal. Judgment and thought content normal.  Nursing note and vitals reviewed.   No visits with results within 3 Day(s) from this visit.  Latest known visit with results is:  Orders Only on 01/24/2019  Component Date Value Ref Range Status  . Iron 01/23/2019 58  28 - 170 ug/dL Final  . TIBC 95/62/130809/06/2018 411  250 - 450 ug/dL Final  . Saturation Ratios 01/23/2019 14  10.4 - 31.8 % Final  . UIBC 01/23/2019 353  ug/dL Final   Performed at [facility], [address] [place], [place] [address]  . LDH  01/23/2019 147  98 - 192 U/L Final   Comment: PLEASE INTREPRET RESULTS WITH CAUTION TEST WAS ADDED ON TO SAMPLE THAT WAS STORED AT 2 TO 8 C Performed at [facility], [address], [place], [place] [address]   . Path Review 01/23/2019 Peripheral blood smear is reviewed.   Final   Comment: Microcytic, hypochromic anemia with mild anisopoikilocytosis, including rare targetoid cells.  Mild polychromasia. Normal leukocyte count and differential. Mildly increased platelet count with unremarkable morphology. Findings highly suggestive of iron deficiency anemia. Recommend correlation with serum iron studies.  Reviewed by Kathi Simpers, M.D. Performed at [facility], [address], [place], [place] [address]   . Fecal Occult Bld 01/23/2019 NEGATIVE  NEGATIVE Final   Performed at [facility], [address] [place], [place] [address]   . Fecal Occult Bld 01/24/2019 NEGATIVE  NEGATIVE Final   Performed at [facility], [address] [place], [place] [address]  . Fecal Occult Bld 01/25/2019 NEGATIVE  NEGATIVE Final   Performed at [facility], [address], [place], [place] [address]    Assessment:  ALYZE LAUF is a 85 y.o. female with iron deficiency anemia.  She has had a microcytic anemia dating back to at least 04/2016.  Hemoglobin has ranged between 7.2-8.3.  Iron saturation was 4% on 01/16/2019.  Diet appears good.  She does not eat red meat.    Labs on 01/16/2019:  hematocrit 26.2, hemoglobin 7.2, MCV 68.3, platelets 386,000, WBC 6,000 (ANC 3500).  Iron saturation 4% with a TIBC 471.5.  TSH 1.723.  Work-up on 01/23/2019 revealed a hematocrit of 26.0, hemoglobin 7.5, MCV 69.1, platelets 396,000, WBC 5400 with an ANC of 3100.  Retic was 3.3%.  Ferritin was 90 with an iron saturation of 14% with a TIBC of 411.  LDH was 147.  B12 was 235 (low).  SPEP on 02/04/2019 was negative.  Peripheral smear revealed microcytic hypochromic anemia with mild anisopoikilocytosis including rare target cells.  There was mild polychromasia, normal leukocyte count and differential and mildly increased platelet count with unremarkable morphology.  Findings were suggestive of iron deficiency anemia.  Urinalysis revealed no hematuria.  Guaiac cards x4 were negative  She is on oral iron.  Ferritin has been followed: 90 on 01/23/2019 and 41 on 02/04/2019.  She has never had a colonoscopy.  Symptomatically, she is doing well.  Exam is unremarkable.  Hemoglobin is 9.0.  Plan: 1.   Review labs from 02/04/2019. 2.   Iron deficiency anemia             Clinically, she is doing well.    Hemoglobin has improved on oral iron.     Hemoglobin was 7.2 with an MCV of 68.3 on 01/16/2019.   Hemoglobin was 9.0 with an MCV of 76.2 on 02/04/2019.             She continues ferrous sulfate 3x/day since 01/23/2019.              Urinalysis and guaiac cards revealed no evidence of GU/GI bleeding.  No Venofer needed.  Continue to monitor. 3.   RTC on 02/26/2019 for MD assessment, labs (CBC with diff, ferritin, iron studies, B12- day before).  I discussed the assessment and treatment plan with the patient.  The patient was provided an opportunity to ask questions and all were answered.  The patient agreed with the plan and demonstrated an understanding of the instructions.  The  patient was advised to call back if the symptoms worsen or if the condition fails to improve as anticipated.   Rosey Bath C , MD, PhD    01/31/2019, 1:51 PM  I, Theador HawthorneAlexis Patterson, am acting as scribe for [employer] C. Merlene Pullingorcoran, MD, PhD.  I,  C. Merlene Pullingorcoran, MD, have reviewed the above documentation for accuracy and completeness, and I agree with the above.

## 2019-02-01 ENCOUNTER — Other Ambulatory Visit: Payer: Self-pay | Admitting: Hematology and Oncology

## 2019-02-01 ENCOUNTER — Other Ambulatory Visit: Payer: Self-pay

## 2019-02-04 ENCOUNTER — Other Ambulatory Visit: Payer: Self-pay

## 2019-02-04 ENCOUNTER — Inpatient Hospital Stay: Payer: Medicare Other

## 2019-02-04 DIAGNOSIS — D509 Iron deficiency anemia, unspecified: Secondary | ICD-10-CM | POA: Diagnosis not present

## 2019-02-04 DIAGNOSIS — D649 Anemia, unspecified: Secondary | ICD-10-CM

## 2019-02-04 LAB — CBC WITH DIFFERENTIAL/PLATELET
Abs Immature Granulocytes: 0.02 10*3/uL (ref 0.00–0.07)
Basophils Absolute: 0.1 10*3/uL (ref 0.0–0.1)
Basophils Relative: 1 %
Eosinophils Absolute: 0.3 10*3/uL (ref 0.0–0.5)
Eosinophils Relative: 4 %
HCT: 30.8 % — ABNORMAL LOW (ref 36.0–46.0)
Hemoglobin: 9 g/dL — ABNORMAL LOW (ref 12.0–15.0)
Immature Granulocytes: 0 %
Lymphocytes Relative: 28 %
Lymphs Abs: 1.6 10*3/uL (ref 0.7–4.0)
MCH: 22.3 pg — ABNORMAL LOW (ref 26.0–34.0)
MCHC: 29.2 g/dL — ABNORMAL LOW (ref 30.0–36.0)
MCV: 76.2 fL — ABNORMAL LOW (ref 80.0–100.0)
Monocytes Absolute: 0.6 10*3/uL (ref 0.1–1.0)
Monocytes Relative: 10 %
Neutro Abs: 3.4 10*3/uL (ref 1.7–7.7)
Neutrophils Relative %: 57 %
Platelets: 322 10*3/uL (ref 150–400)
RBC: 4.04 MIL/uL (ref 3.87–5.11)
WBC: 5.9 10*3/uL (ref 4.0–10.5)
nRBC: 0 % (ref 0.0–0.2)

## 2019-02-04 LAB — FERRITIN: Ferritin: 41 ng/mL (ref 11–307)

## 2019-02-04 LAB — SEDIMENTATION RATE: Sed Rate: 30 mm/hr (ref 0–30)

## 2019-02-05 ENCOUNTER — Encounter: Payer: Self-pay | Admitting: Hematology and Oncology

## 2019-02-05 ENCOUNTER — Inpatient Hospital Stay (HOSPITAL_BASED_OUTPATIENT_CLINIC_OR_DEPARTMENT_OTHER): Payer: Medicare Other | Admitting: Hematology and Oncology

## 2019-02-05 ENCOUNTER — Inpatient Hospital Stay: Payer: Medicare Other

## 2019-02-05 VITALS — BP 150/68 | HR 89 | Temp 97.9°F | Resp 18 | Ht 59.0 in | Wt 118.7 lb

## 2019-02-05 DIAGNOSIS — D509 Iron deficiency anemia, unspecified: Secondary | ICD-10-CM

## 2019-02-05 LAB — PROTEIN ELECTROPHORESIS, SERUM
A/G Ratio: 1.3 (ref 0.7–1.7)
Albumin ELP: 3.4 g/dL (ref 2.9–4.4)
Alpha-1-Globulin: 0.1 g/dL (ref 0.0–0.4)
Alpha-2-Globulin: 0.9 g/dL (ref 0.4–1.0)
Beta Globulin: 0.8 g/dL (ref 0.7–1.3)
Gamma Globulin: 0.8 g/dL (ref 0.4–1.8)
Globulin, Total: 2.7 g/dL (ref 2.2–3.9)
Total Protein ELP: 6.1 g/dL (ref 6.0–8.5)

## 2019-02-05 LAB — HAPTOGLOBIN: Haptoglobin: 205 mg/dL (ref 41–333)

## 2019-02-05 NOTE — Progress Notes (Signed)
The patient c/o having heart palpatiton  But better today

## 2019-02-21 NOTE — Progress Notes (Signed)
[facility]  [address], [address] [place], [place] [address] Phone: [phone]  Fax: [phone]   Clinic Day:  02/26/2019  Referring physician: Myrene Reed, Kirsten Reed, *  Chief Complaint: Kirsten PeachCarol M Reed is a 85 y.o. female with iron deficiency anemia who is seen for 3 week assessment.  HPI: The patient was last seen in the hematology clinic on 02/05/2019.  At that time, she was doing well. Exam was unremarkable. Hemoglobin was 9.0.   Ferritin was 41.  She continued ferrous sulfate TID.   Patient was seen by PCP on 02/14/2019 for an annual wellness visit. Exam was stable. She continued oral iron. She was encouraged to stay active and maintain a healthy diet. She will follow up in 1 year.   During the interim, she has been doing good. She states "I feel better today than I did 4 years ago". She has chronic lower back pain. She is taking oral iron TID with vitamin C. She has been taking B 1,000 daily since 01/23/2019.  She notes dark stool secondary to oral iron. She denies any blood in her stool and constipation.  Labs on 02/25/2019 included a hematocrit of 33.5, hemoglobin 10.2, MCV 80.1, platelets 328,000, white count 6100 with an ANC of 3100.  Ferritin was 27 with an iron saturation of 10% and a TIBC of 312.  B12 was 146.   Past Medical History:  Diagnosis Date  . Broken wrist   . Restless leg syndrome   . Scoliosis   . Shingles 05/2016  . Wears dentures    full upper    Past Surgical History:  Procedure Laterality Date  . CATARACT EXTRACTION W/PHACO Left 03/13/2018   Procedure: CATARACT EXTRACTION PHACO AND INTRAOCULAR LENS PLACEMENT (IOC) LEFT;  Surgeon: Nevada CraneKing, Bradley Mark, MD;  [facility];  Service: Ophthalmology;  Laterality: Left;  . CATARACT EXTRACTION W/PHACO Right 05/28/2018   Procedure: CATARACT EXTRACTION PHACO AND INTRAOCULAR LENS PLACEMENT (IOC) RIGHT;  Surgeon: Nevada CraneKing, Bradley Mark, MD;  [facility];   Service: Ophthalmology;  Laterality: Right;    Family History  Problem Relation Age of Onset  . Hypertension Mother   . Diabetes Mother     Social History:  reports that she has never smoked. She has never used smokeless tobacco. She reports that she does not drink alcohol or use drugs. She is retired. 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 Her husband's name is Therapist, nutritionalBilly. She lives in [place]. The patient is alone today.  Allergies:  Allergies  Allergen Reactions  . Sulfa Antibiotics     Current Medications: Current Outpatient Medications  Medication Sig Dispense Refill  . Ascorbic Acid (VITAMIN C) 500 MG CAPS Take 1 tablet by mouth daily.    Marland Kitchen. aspirin 81 MG chewable tablet Chew 81 mg by mouth as needed.    Marland Kitchen. CALCIUM CARBONATE ANTACID PO Take 1 tablet by mouth as needed.    . cyanocobalamin 1000 MCG tablet Take 1 tablet by mouth daily.    . ferrous sulfate 325 (65 FE) MG tablet 3 (three) times daily with meals.      No current facility-administered medications for this visit.     Review of Systems  Constitutional: Negative.  Negative for chills, diaphoresis, fever, malaise/fatigue and weight loss (up 2 pounds).       Doing good. "I feel better today than I did 4 years ago".  HENT: Negative for congestion, hearing loss, sinus pain and sore throat.  Eyes: Negative.  Negative for blurred vision, double vision, photophobia and pain.       S/p cataract surgery 04/2018 and 05/2018.  Respiratory: Negative.  Negative for cough, sputum production and shortness of breath.   Cardiovascular: Negative.  Negative for chest pain, palpitations, orthopnea, leg swelling and PND.  Gastrointestinal: Negative for abdominal pain, blood in stool, constipation, diarrhea, melena (dark sttol on oral iron), nausea and vomiting.       No prior colonoscopy.  Genitourinary: Negative.  Negative for dysuria, frequency, hematuria and urgency.  Musculoskeletal: Positive for back pain  (chronic, lower). Negative for joint pain and myalgias.  Skin: Negative.  Negative for rash.  Neurological: Negative.  Negative for dizziness, tingling, sensory change, focal weakness, weakness and headaches.  Endo/Heme/Allergies: Negative.  Does not bruise/bleed easily.  Psychiatric/Behavioral: Negative.  Negative for depression and memory loss. The patient is not nervous/anxious and does not have insomnia.   All other systems reviewed and are negative.  Performance status (ECOG):  0  Vitals Blood pressure (!) 151/67, pulse 92, temperature 98.2 F (36.8 C), temperature source Tympanic, resp. rate 18, height  (1.499 m), weight 120 lb 11.2 oz (54.8 kg), SpO2 100 %.  Physical Exam  Constitutional: She is oriented to person, place, and time. She appears well-developed and well-nourished. No distress.  HENT:  Head: Normocephalic and atraumatic.  Mouth/Throat: Oropharynx is clear and moist. No oropharyngeal exudate.  Short gray hair.  Mask.  Eyes: Conjunctivae and EOM are normal. No scleral icterus.  Glasses.  Brown eyes with arcus s/p cataract surgery.  Neck: Normal range of motion. Neck supple. No JVD present.  Cardiovascular: Normal rate, regular rhythm and normal heart sounds.  No murmur heard. Pulmonary/Chest: Effort normal and breath sounds normal. No respiratory distress. She has no wheezes. She has no rales.  Abdominal: Soft. Bowel sounds are normal. She exhibits no distension. There is no abdominal tenderness.  Musculoskeletal:        General: No tenderness or edema.  Neurological: She is alert and oriented to person, place, and time.  Skin: Skin is warm and dry. No rash noted. She is not diaphoretic. No erythema. No pallor.  Psychiatric: She has a normal mood and affect. Her behavior is normal. Judgment and thought content normal.  Nursing note and vitals reviewed.   Appointment on 02/25/2019  Component Date Value Ref Range Status  . Vitamin B-12 02/25/2019 146* 180 -  914 pg/mL Final   Comment: (NOTE) This assay is not validated for testing neonatal or myeloproliferative syndrome specimens for Vitamin B12 levels. Performed at [facility], [address] [place], [place] [address]   . Ferritin 02/25/2019 27  11 - 307 ng/mL Final   Performed at [facility], [address] [place], [place] [address]  . Iron 02/25/2019 32  28 - 170 ug/dL Final  . TIBC 30/86/5784 312  250 - 450 ug/dL Final  . Saturation Ratios 02/25/2019 10* 10.4 - 31.8 % Final  . UIBC 02/25/2019 280  ug/dL Final   Performed at [facility], [address] [place], [place] [address]  . WBC 02/25/2019 6.1  4.0 - 10.5 K/uL Final  . RBC 02/25/2019 4.18  3.87 - 5.11 MIL/uL Final  . Hemoglobin 02/25/2019 10.2* 12.0 - 15.0 g/dL Final  . HCT 52/84/1324 33.5* 36.0 - 46.0 % Final  . MCV 02/25/2019 80.1  80.0 - 100.0 fL Final  . MCH 02/25/2019 24.4* 26.0 - 34.0 pg Final  . MCHC  02/25/2019 30.4  30.0 - 36.0 g/dL Final  . RDW 02/25/2019 Not Measured  11.5 - 15.5 % Final  . Platelets 02/25/2019 328  150 - 400 K/uL Final  . nRBC 02/25/2019 0.0  0.0 - 0.2 % Final  . Neutrophils Relative % 02/25/2019 50  % Final  . Neutro Abs 02/25/2019 3.1  1.7 - 7.7 K/uL Final  . Band Neutrophils 02/25/2019 1  % Final  . Lymphocytes Relative 02/25/2019 30  % Final  . Lymphs Abs 02/25/2019 1.8  0.7 - 4.0 K/uL Final  . Monocytes Relative 02/25/2019 11  % Final  . Monocytes Absolute 02/25/2019 0.7  0.1 - 1.0 K/uL Final  . Eosinophils Relative 02/25/2019 8  % Final  . Eosinophils Absolute 02/25/2019 0.5  0.0 - 0.5 K/uL Final  . Basophils Relative 02/25/2019 0  % Final  . Basophils Absolute 02/25/2019 0.0  0.0 - 0.1 K/uL Final  . Smear Review 02/25/2019 Reviewed   Final  . Abs Immature Granulocytes 02/25/2019 0.00  0.00 - 0.07 K/uL Final  . Polychromasia 02/25/2019 PRESENT   Final  . Target Cells 02/25/2019 PRESENT   Final   Performed at [facility], [address] [place], [place] [address]    Assessment:  PAULETTA PICKNEY is a 85 y.o. female with iron deficiency anemia. She has had a microcytic anemia dating back to at least 04/2016. Hemoglobinhas ranged between 7.2-8.3. Iron saturationwas4%on 01/16/2019.Dietappears good. She does not eat red meat.   Labs on08/26/2020:hematocrit 26.2, hemoglobin 7.2, MCV 68.3, platelets 386,000, WBC 6,000 ([place] 3500). Iron saturation4% with a TIBC 471.5. TSH 1.723.  Work-up on 01/23/2019 revealed a hematocrit of 26.0, hemoglobin 7.5, MCV 69.1, platelets 396,000, WBC 5400 with an ANC of 3100.  Retic was 3.3%.  Ferritin was 90 with an iron saturation of 14% with a TIBC of 411.  LDH was 147.  B12 was 235 (low).  SPEP on 02/04/2019 was negative.  Peripheral smear revealed microcytic hypochromic anemia with mild anisopoikilocytosis including rare target cells.  There was mild polychromasia, normal leukocyte count and differential and mildly increased platelet count with unremarkable morphology.  Findings were suggestive of iron deficiency anemia.  Urinalysis revealed no hematuria.  Guaiac cards x4 were negative  She is on oral iron.  Ferritin has been followed: 90 on 01/23/2019, 41 on 02/04/2019, and 27 on 02/25/2019.  She has never had a colonoscopy.  She has B12 deficiency.  She began oral B12 on 01/23/2019.  B12 was 235 on 01/23/2019 and 146 on 02/25/2019.  Symptomatically, she feels good.  Stool is dark on oral iron.  Exam is unremarkable. Plan: 1.   Review labs from 02/25/2019.  2.   Iron deficiency anemia Clinically, she is doing extremely well.              Hemoglobin continues to improve on oral iron.                           Hematocrit 26.0.  Hemoglobin 7.5.  MCV 69.1 on 01/23/2019.                         Hematocrit 30.8.  Hemoglobin 9.0.  MCV 76.2 on 02/04/2019.   Hematocrit 33.5.  Hemoglobin 10.2.  MCV 80.1 on 02/25/2019.  She has been on ferrous sulfate 3x/day since 01/23/2019.  Urinalysis and guaiac cards revealed no evidence of GU/GI bleeding.  Continue to monitor. 3.  B12 deficiency  B12 was 235 on 01/23/2019 and 146 on 02/25/2019 despite oral B12 x 1 month.  B12 goal is 400.  Suspect pernicious anemia.  Discuss discontinuation of oral B12 and initiation of B12 injections.  Begin B12 weekly x 6 then monthly. 4.   RTC in 10 weeks for MD assessment, labs (CBC with diff, ferritin, iron studies, anti-parietal antibodies, intrinsic factor antibodies- day before), and 1st monthly B12 injection.   I discussed the assessment and treatment plan with the patient.  The patient was provided an opportunity to ask questions and all were answered.  The patient agreed with the plan and demonstrated an understanding of the instructions.  The patient was advised to call back if the symptoms worsen or if the condition fails to improve as anticipated.  I provided 10 minutes of face-to-face time during this this encounter and > 50% was spent counseling as documented under my assessment and plan.    Rosey Bath, MD, PhD    02/26/2019, 10:01 AM  I, Theador Hawthorne, am acting as scribe for [employer]. Merlene Pulling, MD, PhD.  I, Melissa C. Merlene Pulling, MD, have reviewed the above documentation for accuracy and completeness, and I agree with the above.

## 2019-02-25 ENCOUNTER — Inpatient Hospital Stay: Payer: Medicare Other | Attending: Hematology and Oncology

## 2019-02-25 ENCOUNTER — Other Ambulatory Visit: Payer: Self-pay

## 2019-02-25 DIAGNOSIS — M545 Low back pain: Secondary | ICD-10-CM | POA: Insufficient documentation

## 2019-02-25 DIAGNOSIS — D509 Iron deficiency anemia, unspecified: Secondary | ICD-10-CM | POA: Insufficient documentation

## 2019-02-25 DIAGNOSIS — Z7982 Long term (current) use of aspirin: Secondary | ICD-10-CM | POA: Insufficient documentation

## 2019-02-25 DIAGNOSIS — E538 Deficiency of other specified B group vitamins: Secondary | ICD-10-CM | POA: Diagnosis present

## 2019-02-25 DIAGNOSIS — G8929 Other chronic pain: Secondary | ICD-10-CM | POA: Diagnosis not present

## 2019-02-25 DIAGNOSIS — K921 Melena: Secondary | ICD-10-CM | POA: Diagnosis not present

## 2019-02-25 LAB — CBC WITH DIFFERENTIAL/PLATELET
Abs Immature Granulocytes: 0 10*3/uL (ref 0.00–0.07)
Band Neutrophils: 1 %
Basophils Absolute: 0 10*3/uL (ref 0.0–0.1)
Basophils Relative: 0 %
Eosinophils Absolute: 0.5 10*3/uL (ref 0.0–0.5)
Eosinophils Relative: 8 %
HCT: 33.5 % — ABNORMAL LOW (ref 36.0–46.0)
Hemoglobin: 10.2 g/dL — ABNORMAL LOW (ref 12.0–15.0)
Lymphocytes Relative: 30 %
Lymphs Abs: 1.8 10*3/uL (ref 0.7–4.0)
MCH: 24.4 pg — ABNORMAL LOW (ref 26.0–34.0)
MCHC: 30.4 g/dL (ref 30.0–36.0)
MCV: 80.1 fL (ref 80.0–100.0)
Monocytes Absolute: 0.7 10*3/uL (ref 0.1–1.0)
Monocytes Relative: 11 %
Neutro Abs: 3.1 10*3/uL (ref 1.7–7.7)
Neutrophils Relative %: 50 %
Platelets: 328 10*3/uL (ref 150–400)
RBC: 4.18 MIL/uL (ref 3.87–5.11)
WBC: 6.1 10*3/uL (ref 4.0–10.5)
nRBC: 0 % (ref 0.0–0.2)

## 2019-02-25 LAB — IRON AND TIBC
Iron: 32 ug/dL (ref 28–170)
Saturation Ratios: 10 % — ABNORMAL LOW (ref 10.4–31.8)
TIBC: 312 ug/dL (ref 250–450)
UIBC: 280 ug/dL

## 2019-02-25 LAB — VITAMIN B12: Vitamin B-12: 146 pg/mL — ABNORMAL LOW (ref 180–914)

## 2019-02-25 LAB — FERRITIN: Ferritin: 27 ng/mL (ref 11–307)

## 2019-02-25 NOTE — Progress Notes (Signed)
Confirmed Name, DOB, and Address. Denies any concerns at this time.  

## 2019-02-26 ENCOUNTER — Inpatient Hospital Stay: Payer: Medicare Other

## 2019-02-26 ENCOUNTER — Encounter: Payer: Self-pay | Admitting: Hematology and Oncology

## 2019-02-26 ENCOUNTER — Inpatient Hospital Stay (HOSPITAL_BASED_OUTPATIENT_CLINIC_OR_DEPARTMENT_OTHER): Payer: Medicare Other | Admitting: Hematology and Oncology

## 2019-02-26 VITALS — BP 151/67 | HR 92 | Temp 98.2°F | Resp 18 | Ht 59.0 in | Wt 120.7 lb

## 2019-02-26 DIAGNOSIS — D509 Iron deficiency anemia, unspecified: Secondary | ICD-10-CM

## 2019-02-26 DIAGNOSIS — E538 Deficiency of other specified B group vitamins: Secondary | ICD-10-CM

## 2019-02-26 MED ORDER — CYANOCOBALAMIN 1000 MCG/ML IJ SOLN
1000.0000 ug | Freq: Once | INTRAMUSCULAR | Status: AC
  Administered 2019-02-26: 1000 ug via INTRAMUSCULAR

## 2019-02-26 NOTE — Progress Notes (Signed)
No new changes noted today 

## 2019-02-26 NOTE — Patient Instructions (Signed)

## 2019-03-05 ENCOUNTER — Other Ambulatory Visit: Payer: Self-pay

## 2019-03-05 ENCOUNTER — Inpatient Hospital Stay: Payer: Medicare Other

## 2019-03-05 VITALS — BP 168/78 | HR 88 | Temp 97.3°F | Resp 16

## 2019-03-05 DIAGNOSIS — D509 Iron deficiency anemia, unspecified: Secondary | ICD-10-CM

## 2019-03-05 MED ORDER — CYANOCOBALAMIN 1000 MCG/ML IJ SOLN
1000.0000 ug | Freq: Once | INTRAMUSCULAR | Status: AC
  Administered 2019-03-05: 1000 ug via INTRAMUSCULAR

## 2019-03-05 NOTE — Patient Instructions (Signed)
Managing Your Hypertension Hypertension is commonly called high blood pressure. This is when the force of your blood pressing against the walls of your arteries is too strong. Arteries are blood vessels that carry blood from your heart throughout your body. Hypertension forces the heart to work harder to pump blood, and may cause the arteries to become narrow or stiff. Having untreated or uncontrolled hypertension can cause heart attack, stroke, kidney disease, and other problems. What are blood pressure readings? A blood pressure reading consists of a higher number over a lower number. Ideally, your blood pressure should be below 120/80. The first ("top") number is called the systolic pressure. It is a measure of the pressure in your arteries as your heart beats. The second ("bottom") number is called the diastolic pressure. It is a measure of the pressure in your arteries as the heart relaxes. What does my blood pressure reading mean? Blood pressure is classified into four stages. Based on your blood pressure reading, your health care provider may use the following stages to determine what type of treatment you need, if any. Systolic pressure and diastolic pressure are measured in a unit called mm Hg. Normal  Systolic pressure: below 784.  Diastolic pressure: below 80. Elevated  Systolic pressure: 696-295.  Diastolic pressure: below 80. Hypertension stage 1  Systolic pressure: 284-132.  Diastolic pressure: 44-01. Hypertension stage 2  Systolic pressure: 027 or above.  Diastolic pressure: 90 or above. What health risks are associated with hypertension? Managing your hypertension is an important responsibility. Uncontrolled hypertension can lead to:  A heart attack.  A stroke.  A weakened blood vessel (aneurysm).  Heart failure.  Kidney damage.  Eye damage.  Metabolic syndrome.  Memory and concentration problems. What changes can I make to manage my hypertension?  Hypertension can be managed by making lifestyle changes and possibly by taking medicines. Your health care provider will help you make a plan to bring your blood pressure within a normal range. Eating and drinking   Eat a diet that is high in fiber and potassium, and low in salt (sodium), added sugar, and fat. An example eating plan is called the DASH (Dietary Approaches to Stop Hypertension) diet. To eat this way: ? Eat plenty of fresh fruits and vegetables. Try to fill half of your plate at each meal with fruits and vegetables. ? Eat whole grains, such as whole wheat pasta, brown rice, or whole grain bread. Fill about one quarter of your plate with whole grains. ? Eat low-fat diary products. ? Avoid fatty cuts of meat, processed or cured meats, and poultry with skin. Fill about one quarter of your plate with lean proteins such as fish, chicken without skin, beans, eggs, and tofu. ? Avoid premade and processed foods. These tend to be higher in sodium, added sugar, and fat.  Reduce your daily sodium intake. Most people with hypertension should eat less than 1,500 mg of sodium a day.  Limit alcohol intake to no more than 1 drink a day for nonpregnant women and 2 drinks a day for men. One drink equals 12 oz of beer, 5 oz of wine, or 1 oz of hard liquor. Lifestyle  Work with your health care provider to maintain a healthy body weight, or to lose weight. Ask what an ideal weight is for you.  Get at least 30 minutes of exercise that causes your heart to beat faster (aerobic exercise) most days of the week. Activities may include walking, swimming, or biking.  Include exercise  to strengthen your muscles (resistance exercise), such as weight lifting, as part of your weekly exercise routine. Try to do these types of exercises for 30 minutes at least 3 days a week.  Do not use any products that contain nicotine or tobacco, such as cigarettes and e-cigarettes. If you need help quitting, ask your health  care provider.  Control any long-term (chronic) conditions you have, such as high cholesterol or diabetes. Monitoring  Monitor your blood pressure at home as told by your health care provider. Your personal target blood pressure may vary depending on your medical conditions, your age, and other factors.  Have your blood pressure checked regularly, as often as told by your health care provider. Working with your health care provider  Review all the medicines you take with your health care provider because there may be side effects or interactions.  Talk with your health care provider about your diet, exercise habits, and other lifestyle factors that may be contributing to hypertension.  Visit your health care provider regularly. Your health care provider can help you create and adjust your plan for managing hypertension. Will I need medicine to control my blood pressure? Your health care provider may prescribe medicine if lifestyle changes are not enough to get your blood pressure under control, and if:  Your systolic blood pressure is 130 or higher.  Your diastolic blood pressure is 80 or higher. Take medicines only as told by your health care provider. Follow the directions carefully. Blood pressure medicines must be taken as prescribed. The medicine does not work as well when you skip doses. Skipping doses also puts you at risk for problems. Contact a health care provider if:  You think you are having a reaction to medicines you have taken.  You have repeated (recurrent) headaches.  You feel dizzy.  You have swelling in your ankles.  You have trouble with your vision. Get help right away if:  You develop a severe headache or confusion.  You have unusual weakness or numbness, or you feel faint.  You have severe pain in your chest or abdomen.  You vomit repeatedly.  You have trouble breathing. Summary  Hypertension is when the force of blood pumping through your arteries  is too strong. If this condition is not controlled, it may put you at risk for serious complications.  Your personal target blood pressure may vary depending on your medical conditions, your age, and other factors. For most people, a normal blood pressure is less than 120/80.  Hypertension is managed by lifestyle changes, medicines, or both. Lifestyle changes include weight loss, eating a healthy, low-sodium diet, exercising more, and limiting alcohol. This information is not intended to replace advice given to you by your health care provider. Make sure you discuss any questions you have with your health care provider. Document Released: 02/01/2012 Document Revised: 08/31/2018 Document Reviewed: 04/06/2016 Elsevier Patient Education  2020 [employer] Hypertension, Adult Hypertension is another name for high blood pressure. High blood pressure forces your heart to work harder to pump blood. This can cause problems over time. There are two numbers in a blood pressure reading. There is a top number (systolic) over a bottom number (diastolic). It is best to have a blood pressure that is below 120/80. Healthy choices can help lower your blood pressure, or you may need medicine to help lower it. What are the causes? The cause of this condition is not known. Some conditions may be related to high blood pressure. What increases  the risk?  Smoking.  Having type 2 diabetes mellitus, high cholesterol, or both.  Not getting enough exercise or physical activity.  Being overweight.  Having too much fat, sugar, calories, or salt (sodium) in your diet.  Drinking too much alcohol.  Having long-term (chronic) kidney disease.  Having a family history of high blood pressure.  Age. Risk increases with age.  Race. You may be at higher risk if you are African American.  Gender. Men are at higher risk than women before age 22. After age 55, women are at higher risk than men.  Having obstructive  sleep apnea.  Stress. What are the signs or symptoms?  High blood pressure may not cause symptoms. Very high blood pressure (hypertensive crisis) may cause: ? Headache. ? Feelings of worry or nervousness (anxiety). ? Shortness of breath. ? Nosebleed. ? A feeling of being sick to your stomach (nausea). ? Throwing up (vomiting). ? Changes in how you see. ? Very bad chest pain. ? Seizures. How is this treated?  This condition is treated by making healthy lifestyle changes, such as: ? Eating healthy foods. ? Exercising more. ? Drinking less alcohol.  Your health care provider may prescribe medicine if lifestyle changes are not enough to get your blood pressure under control, and if: ? Your top number is above 130. ? Your bottom number is above 80.  Your personal target blood pressure may vary. Follow these instructions at home: Eating and drinking   If told, follow the DASH eating plan. To follow this plan: ? Fill one half of your plate at each meal with fruits and vegetables. ? Fill one fourth of your plate at each meal with whole grains. Whole grains include whole-wheat pasta, brown rice, and whole-grain bread. ? Eat or drink low-fat dairy products, such as skim milk or low-fat yogurt. ? Fill one fourth of your plate at each meal with low-fat (lean) proteins. Low-fat proteins include fish, chicken without skin, eggs, beans, and tofu. ? Avoid fatty meat, cured and processed meat, or chicken with skin. ? Avoid pre-made or processed food.  Eat less than 1,500 mg of salt each day.  Do not drink alcohol if: ? Your doctor tells you not to drink. ? You are pregnant, may be pregnant, or are planning to become pregnant.  If you drink alcohol: ? Limit how much you use to:  0-1 drink a day for women.  0-2 drinks a day for men. ? Be aware of how much alcohol is in your drink. In the [place] one drink equals one 12 oz bottle of beer (355 mL), one 5 oz glass of wine (148 mL), or one  1 oz glass of hard liquor (44 mL). Lifestyle   Work with your doctor to stay at a healthy weight or to lose weight. Ask your doctor what the best weight is for you.  Get at least 30 minutes of exercise most days of the week. This may include walking, swimming, or biking.  Get at least 30 minutes of exercise that strengthens your muscles (resistance exercise) at least 3 days a week. This may include lifting weights or doing Pilates.  Do not use any products that contain nicotine or tobacco, such as cigarettes, e-cigarettes, and chewing tobacco. If you need help quitting, ask your doctor.  Check your blood pressure at home as told by your doctor.  Keep all follow-up visits as told by your doctor. This is important. Medicines  Take over-the-counter and prescription medicines only as  told by your doctor. Follow directions carefully.  Do not skip doses of blood pressure medicine. The medicine does not work as well if you skip doses. Skipping doses also puts you at risk for problems.  Ask your doctor about side effects or reactions to medicines that you should watch for. Contact a doctor if you:  Think you are having a reaction to the medicine you are taking.  Have headaches that keep coming back (recurring).  Feel dizzy.  Have swelling in your ankles.  Have trouble with your vision. Get help right away if you:  Get a very bad headache.  Start to feel mixed up (confused).  Feel weak or numb.  Feel faint.  Have very bad pain in your: ? Chest. ? Belly (abdomen).  Throw up more than once.  Have trouble breathing. Summary  Hypertension is another name for high blood pressure.  High blood pressure forces your heart to work harder to pump blood.  For most people, a normal blood pressure is less than 120/80.  Making healthy choices can help lower blood pressure. If your blood pressure does not get lower with healthy choices, you may need to take medicine. This  information is not intended to replace advice given to you by your health care provider. Make sure you discuss any questions you have with your health care provider. Document Released: 10/26/2007 Document Revised: 01/17/2018 Document Reviewed: 01/17/2018 Elsevier Patient Education  2020 [employer].

## 2019-03-05 NOTE — Progress Notes (Signed)
Patient's blood pressure elevated and rechecked to be 144/78.  Patient given instructions about high blood pressure and how to manage it.  She was also encouraged to make an appointment with her primary care to follow up on a possible medication to help her blood pressure.  Exercise was encouraged and patient verbalized understanding.

## 2019-03-12 ENCOUNTER — Inpatient Hospital Stay: Payer: Medicare Other

## 2019-03-12 ENCOUNTER — Other Ambulatory Visit: Payer: Self-pay

## 2019-03-12 VITALS — BP 140/66 | HR 89 | Temp 96.5°F | Resp 17

## 2019-03-12 DIAGNOSIS — D509 Iron deficiency anemia, unspecified: Secondary | ICD-10-CM | POA: Diagnosis not present

## 2019-03-12 MED ORDER — IRON SUCROSE 20 MG/ML IV SOLN: 200.0000 mg | Freq: Once | INTRAVENOUS | Status: DC

## 2019-03-12 MED ORDER — SODIUM CHLORIDE 0.9 % IV SOLN: 200.0000 mg | Freq: Once | INTRAVENOUS | Status: DC

## 2019-03-12 MED ORDER — CYANOCOBALAMIN 1000 MCG/ML IJ SOLN
1000.0000 ug | Freq: Once | INTRAMUSCULAR | Status: AC
  Administered 2019-03-12: 09:00:00 1000 ug via INTRAMUSCULAR

## 2019-03-19 ENCOUNTER — Encounter: Payer: Self-pay | Admitting: *Deleted

## 2019-03-19 ENCOUNTER — Other Ambulatory Visit: Payer: Self-pay

## 2019-03-19 ENCOUNTER — Inpatient Hospital Stay: Payer: Medicare Other

## 2019-03-19 VITALS — BP 138/72 | HR 88 | Temp 96.4°F | Resp 18

## 2019-03-19 DIAGNOSIS — D509 Iron deficiency anemia, unspecified: Secondary | ICD-10-CM | POA: Diagnosis not present

## 2019-03-19 MED ORDER — CYANOCOBALAMIN 1000 MCG/ML IJ SOLN
1000.0000 ug | Freq: Once | INTRAMUSCULAR | Status: AC
  Administered 2019-03-19: 09:00:00 1000 ug via INTRAMUSCULAR

## 2019-03-25 ENCOUNTER — Other Ambulatory Visit: Payer: Self-pay

## 2019-03-26 ENCOUNTER — Inpatient Hospital Stay: Payer: Medicare Other | Attending: Hematology and Oncology

## 2019-03-26 ENCOUNTER — Other Ambulatory Visit: Payer: Self-pay

## 2019-03-26 VITALS — BP 141/72 | HR 90 | Temp 96.4°F | Resp 16

## 2019-03-26 DIAGNOSIS — D509 Iron deficiency anemia, unspecified: Secondary | ICD-10-CM

## 2019-03-26 DIAGNOSIS — E538 Deficiency of other specified B group vitamins: Secondary | ICD-10-CM | POA: Insufficient documentation

## 2019-03-26 MED ORDER — CYANOCOBALAMIN 1000 MCG/ML IJ SOLN
1000.0000 ug | Freq: Once | INTRAMUSCULAR | Status: AC
  Administered 2019-03-26: 1000 ug via INTRAMUSCULAR

## 2019-03-26 NOTE — Patient Instructions (Signed)

## 2019-04-02 ENCOUNTER — Inpatient Hospital Stay: Payer: Medicare Other

## 2019-04-02 ENCOUNTER — Other Ambulatory Visit: Payer: Self-pay

## 2019-04-02 DIAGNOSIS — E538 Deficiency of other specified B group vitamins: Secondary | ICD-10-CM | POA: Diagnosis not present

## 2019-04-02 DIAGNOSIS — D509 Iron deficiency anemia, unspecified: Secondary | ICD-10-CM

## 2019-04-02 MED ORDER — CYANOCOBALAMIN 1000 MCG/ML IJ SOLN
1000.0000 ug | Freq: Once | INTRAMUSCULAR | Status: AC
  Administered 2019-04-02: 1000 ug via INTRAMUSCULAR

## 2019-05-10 ENCOUNTER — Inpatient Hospital Stay: Payer: Medicare Other | Attending: Oncology

## 2019-05-10 ENCOUNTER — Other Ambulatory Visit: Payer: Self-pay

## 2019-05-10 ENCOUNTER — Inpatient Hospital Stay: Payer: Medicare Other

## 2019-05-10 VITALS — BP 149/79 | HR 87 | Resp 18

## 2019-05-10 DIAGNOSIS — D509 Iron deficiency anemia, unspecified: Secondary | ICD-10-CM

## 2019-05-10 DIAGNOSIS — E538 Deficiency of other specified B group vitamins: Secondary | ICD-10-CM

## 2019-05-10 LAB — IRON AND TIBC
Iron: 56 ug/dL (ref 28–170)
Saturation Ratios: 18 % (ref 10.4–31.8)
TIBC: 308 ug/dL (ref 250–450)
UIBC: 252 ug/dL

## 2019-05-10 LAB — CBC WITH DIFFERENTIAL/PLATELET
Abs Immature Granulocytes: 0.01 10*3/uL (ref 0.00–0.07)
Basophils Absolute: 0.1 10*3/uL (ref 0.0–0.1)
Basophils Relative: 1 %
Eosinophils Absolute: 0.3 10*3/uL (ref 0.0–0.5)
Eosinophils Relative: 5 %
HCT: 35.1 % — ABNORMAL LOW (ref 36.0–46.0)
Hemoglobin: 11.2 g/dL — ABNORMAL LOW (ref 12.0–15.0)
Immature Granulocytes: 0 %
Lymphocytes Relative: 29 %
Lymphs Abs: 1.7 10*3/uL (ref 0.7–4.0)
MCH: 27.9 pg (ref 26.0–34.0)
MCHC: 31.9 g/dL (ref 30.0–36.0)
MCV: 87.5 fL (ref 80.0–100.0)
Monocytes Absolute: 0.6 10*3/uL (ref 0.1–1.0)
Monocytes Relative: 11 %
Neutro Abs: 3.1 10*3/uL (ref 1.7–7.7)
Neutrophils Relative %: 54 %
Platelets: 272 10*3/uL (ref 150–400)
RBC: 4.01 MIL/uL (ref 3.87–5.11)
RDW: 15.3 % (ref 11.5–15.5)
WBC: 5.8 10*3/uL (ref 4.0–10.5)
nRBC: 0 % (ref 0.0–0.2)

## 2019-05-10 LAB — FERRITIN: Ferritin: 33 ng/mL (ref 11–307)

## 2019-05-10 MED ORDER — CYANOCOBALAMIN 1000 MCG/ML IJ SOLN
1000.0000 ug | Freq: Once | INTRAMUSCULAR | Status: AC
  Administered 2019-05-10: 1000 ug via INTRAMUSCULAR

## 2019-05-10 NOTE — Patient Instructions (Signed)

## 2019-05-11 LAB — INTRINSIC FACTOR ANTIBODIES: Intrinsic Factor: 0.9 AU/mL (ref 0.0–1.1)

## 2019-05-13 ENCOUNTER — Other Ambulatory Visit: Payer: Medicare Other

## 2019-05-13 ENCOUNTER — Ambulatory Visit: Payer: Medicare Other

## 2019-05-13 ENCOUNTER — Ambulatory Visit: Payer: Medicare Other | Admitting: Hematology and Oncology

## 2019-05-14 LAB — ANTI-PARIETAL ANTIBODY: Parietal Cell Antibody-IgG: 1.4 Units (ref 0.0–20.0)

## 2019-05-23 NOTE — Progress Notes (Signed)
[facility]  [address], [address] [place], [place] [address] Phone: [phone]  Fax: [phone]   Telephone Visit:  05/28/2019  Referring physician: Myrene BuddyGauger, Sarah Kathryn, *  I connected with Kirsten Peacharol M Setter on 05/28/2019 at 11:28 AM by telephone and verified that I was speaking with the correct person using 2 identifiers.  The patient was at home.  I discussed the limitations, risk, security and privacy concerns of performing an evaluation and management service by telephone and the availability of in person appointments.  I also discussed with the patient that there may be a patient responsible charge related to this service.  The patient expressed understanding and agreed to proceed.   Chief Complaint: Kirsten PeachCarol M Reed is a 85 y.o. female with iron deficiency anemia who is seen for 3 month assessment.  HPI: The patient was last seen in the hematology clinic on 02/26/2019. At that time, she felt good.  Stool was dark on oral iron. Exam was unremarkable. Hematocrit was 33.5, hemoglobin 10.2, platelets 328,000, and WBC 6,100. Ferritin was 27 with an iron saturation of 10% and a TIBC of 312. B12 was 146 (goal 400) despite being on oral B12 x 1 month. She remained on oral iron. She received a B12 injection.   Patient received B12 weekly x 6 (02/26/2019 - 04/02/2019).  She began monthly B12 injections on 05/10/2019.   Labs on 05/10/2019 showed hematocrit 35.1, hemoglobin 11.2, platelets 272,000, WBC 5,800. Ferritin was 33 with an iron saturation of 18% and a TIBC of 308. Intrinsic factor (0.9) and anti-parietal antibody (1.4) were normal.   During the interim, she has felt good.  She remains on oral iron TID. She is tolerating her B12 shots. She notes that after getting her B12 injections, she continued to take oral B12. I advised her to stop the oral B12; patient agreed.   She is eating well. She notes a decrease in appetite but continues to eat three meals a day.  She has chronic low back pain.    Past Medical History:  Diagnosis Date   Broken wrist    Restless leg syndrome    Scoliosis    Shingles 05/2016   Wears dentures    full upper    Past Surgical History:  Procedure Laterality Date   CATARACT EXTRACTION W/PHACO Left 03/13/2018   Procedure: CATARACT EXTRACTION PHACO AND INTRAOCULAR LENS PLACEMENT (IOC) LEFT;  Surgeon: Nevada CraneKing, Bradley Mark, MD;  [facility];  Service: Ophthalmology;  Laterality: Left;   CATARACT EXTRACTION W/PHACO Right 05/28/2018   Procedure: CATARACT EXTRACTION PHACO AND INTRAOCULAR LENS PLACEMENT (IOC) RIGHT;  Surgeon: Nevada CraneKing, Bradley Mark, MD;  [facility];  Service: Ophthalmology;  Laterality: Right;    Family History  Problem Relation Age of Onset   Hypertension Mother    Diabetes Mother     Social History:  reports that she has never smoked. She has never used smokeless tobacco. She reports that she does not drink alcohol or use drugs. She is retired. 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 Her husband's name is Therapist, nutritionalBilly. She [place] in [place]. The patient is alone today.  Participants in the patient's visit and their role in the encounter included the patient and Laurie Pandaoxie Ellington, Charity fundraiserN, today.  The intake visit was provided by Laurie Pandaoxie Ellington, RN.   Allergies:  Allergies  Allergen Reactions   Sulfa Antibiotics     Current Medications: Current Outpatient Medications  Medication Sig Dispense Refill   Ascorbic  Acid (VITAMIN C) 500 MG CAPS Take 1 tablet by mouth daily.     aspirin 81 MG chewable tablet Chew 81 mg by mouth as needed.     chlorhexidine (PERIDEX) 0.12 % solution Use as directed 15 mLs in the mouth or throat 2 (two) times daily.     Cyanocobalamin (VITAMIN B 12 PO) Take 2,000 mg by mouth.     ferrous sulfate 325 (65 FE) MG tablet 3 (three) times daily with meals.      VITAMIN D PO Take 5,000 Units by mouth.     CALCIUM CARBONATE  ANTACID PO Take 1 tablet by mouth as needed.     No current facility-administered medications for this visit.    Review of Systems  Constitutional: Negative.  Negative for chills, diaphoresis, fever, malaise/fatigue and weight loss.       Feels "good".  HENT: Negative.  Negative for congestion, hearing loss, nosebleeds, sinus pain and sore throat.   Eyes: Negative.  Negative for blurred vision, double vision, photophobia and pain.       S/p cataract surgery 04/2018 and 05/2018.  Respiratory: Negative.  Negative for cough, sputum production and shortness of breath.   Cardiovascular: Negative.  Negative for chest pain, palpitations, orthopnea, leg swelling and PND.  Gastrointestinal: Negative.  Negative for abdominal pain, blood in stool, constipation, diarrhea, melena, nausea and vomiting.       No prior colonoscopy. Eating 3 meals/day.  Genitourinary: Negative.  Negative for dysuria, frequency, hematuria and urgency.  Musculoskeletal: Positive for back pain (chronic, lower). Negative for joint pain and myalgias.  Skin: Negative.  Negative for rash.  Neurological: Negative.  Negative for dizziness, tingling, sensory change, focal weakness, weakness and headaches.  Endo/Heme/Allergies: Negative.  Does not bruise/bleed easily.  Psychiatric/Behavioral: Negative.  Negative for depression and memory loss. The patient is not nervous/anxious and does not have insomnia.   All other systems reviewed and are negative.  Performance status (ECOG): 0  Physical Exam  Nursing note reviewed. Constitutional: She is oriented to person, place, and time.  Neurological: She is alert and oriented to person, place, and time.  Psychiatric: She has a normal mood and affect. Her behavior is normal. Judgment and thought content normal.    No visits with results within 3 Day(s) from this visit.  Latest known visit with results is:  Appointment on 05/10/2019  Component Date Value Ref Range Status   Intrinsic  Factor 05/10/2019 0.9  0.0 - 1.1 AU/mL Final   Comment: (NOTE) Performed At: [facility] [address] [place], [place] [id number] Jolene Schimke MD SH:[id number]    Parietal Cell Antibody-IgG 05/10/2019 1.4  0.0 - 20.0 Units Final   Comment: (NOTE)                                Negative    0.0 - 20.0                                Equivocal  20.1 - 24.9                                Positive         >24.9 Parietal Cell Antibodies are found in 90% of patients with pernicious anemia and 30% of first degree relatives with pernicious anemia. Performed  At: [facility] [address] [place], [place] [id number] Jolene Schimke MD [id number]    Iron 05/10/2019 56  28 - 170 ug/dL Final   TIBC 95/28/4132 308  250 - 450 ug/dL Final   Saturation Ratios 05/10/2019 18  10.4 - 31.8 % Final   UIBC 05/10/2019 252  ug/dL Final   Performed at [facility], [address] [place], [place] [address]   Ferritin 05/10/2019 33  11 - 307 ng/mL Final   Performed at [facility], [address] [place], [place] [address]   WBC 05/10/2019 5.8  4.0 - 10.5 K/uL Final   RBC 05/10/2019 4.01  3.87 - 5.11 MIL/uL Final   Hemoglobin 05/10/2019 11.2* 12.0 - 15.0 g/dL Final   HCT 66/44/0347 35.1* 36.0 - 46.0 % Final   MCV 05/10/2019 87.5  80.0 - 100.0 fL Final   MCH 05/10/2019 27.9  26.0 - 34.0 pg Final   MCHC 05/10/2019 31.9  30.0 - 36.0 g/dL Final   RDW 42/59/5638 15.3  11.5 - 15.5 % Final   Platelets 05/10/2019 272  150 - 400 K/uL Final   nRBC 05/10/2019 0.0  0.0 - 0.2 % Final   Neutrophils Relative % 05/10/2019 54  % Final   Neutro Abs 05/10/2019 3.1  1.7 - 7.7 K/uL Final   Lymphocytes Relative 05/10/2019 29  % Final   Lymphs Abs 05/10/2019 1.7  0.7 - 4.0 K/uL Final   Monocytes Relative 05/10/2019 11  % Final   Monocytes Absolute 05/10/2019 0.6  0.1 - 1.0 K/uL Final   Eosinophils Relative 05/10/2019 5  % Final   Eosinophils Absolute  05/10/2019 0.3  0.0 - 0.5 K/uL Final   Basophils Relative 05/10/2019 1  % Final   Basophils Absolute 05/10/2019 0.1  0.0 - 0.1 K/uL Final   Immature Granulocytes 05/10/2019 0  % Final   Abs Immature Granulocytes 05/10/2019 0.01  0.00 - 0.07 K/uL Final   Performed at [facility], [address], [place], [place] [address]    Assessment:  MARKETTA VALADEZ is a 85 y.o. female with iron deficiency anemia. She has had a microcytic anemia dating back to at least 04/2016. Hemoglobinhas ranged between 7.2-8.3. Iron saturationwas4%on 01/16/2019.Dietappears good. She does not eat red meat.   Labs on08/26/2020:hematocrit 26.2, hemoglobin 7.2, MCV 68.3, platelets 386,000, WBC 6,000 (ANC 3500). Iron saturation4% with a TIBC 471.5. TSH 1.723.  Work-upon 09/02/2020revealed a hematocrit of 26.0, hemoglobin 7.5, MCV 69.1, platelets 396,000,WBC 5400 with an ANC of 3100. Retic was 3.3%. Ferritinwas 90 with an iron saturationof14% with a TIBC of 411. LDH was 147. B12was 235 (low). SPEP on 02/04/2019 was negative.Peripheral smearrevealed microcytic hypochromic anemia with mild anisopoikilocytosisincluding rare target cells. There was mild polychromasia, normal leukocyte count and differential and mildly increased platelet count with unremarkable morphology. Findings were suggestive of iron deficiency anemia. Urinalysis revealed no hematuria. Guaiac cards x4 were negative  She is on oral iron. Ferritinhas been followed: 90 on09/06/2018, 41 on09/14/2020, 27 on 02/25/2019, and 33 on 05/10/2019.  She has never had a colonoscopy.  She has B12 deficiency.  She was oral B12 (began 01/23/2019).  B12 was 235 on 01/23/2019 and 146 on 02/25/2019.  She began B12 injections on 02/26/2019 (last 05/10/2019).  Intrinsic factor and anti-parietal antibody were normal on 05/10/2019.   Symptomatically, she feels good.  She denies any bleeding.  Hemoglobin is  11.2.  Plan: 1.   Review labs from 05/10/2019. 2.Iron deficiency anemia Clinically, she is  doing well. Hemoglobin has improved on oral iron.  Hematocrit 26.0.  Hemoglobin 7.5.    MCV 69.1 on 01/23/2019. Hematocrit 30.8.  Hemoglobin 9.0.    MCV 76.2 on 02/04/2019.                         Hematocrit 33.5.  Hemoglobin 10.2.  MCV 80.1 on 02/25/2019.   Hematocrit 35.1.  Hemoglobin 11.2.  MCV 87.5 on 05/10/2019.             She continues ferrous sulfate 3x/day (began 01/23/2019). Urinalysis and guaiac cards revealed no evidence ofGU/GIbleeding.             Continue to monitor. 3.B12 deficiency             B12 was 235 on 01/23/2019 and 146 on 02/25/2019 despite oral B12 x 1 month.             B12 goal is 400.  She began B12 injections on 02/26/2019 (last 05/10/2019).             Patient to stop oral B12.  B12 monthly x 6 (next due 06/13/2019. 4.RTC in 4 months for MD assessment, labs (CBC with diff, ferritin), and B12.  I discussed the assessment and treatment plan with the patient.  The patient was provided an opportunity to ask questions and all were answered.  The patient agreed with the plan and demonstrated an understanding of the instructions.  The patient was advised to call back if the symptoms worsen or if the condition fails to improve as anticipated.  I provided 14 minutes (11:28 AM - 11:42 AM) of non face-to-face telephone visit time during this encounter and > 50% was spent counseling as documented under my assessment and plan.  I provided these services from the [facility] office .   Lequita Asal, MD, PhD    05/28/2019, 11:28 AM  I, Selena Batten, am acting as scribe for [employer]. Mike Gip, MD, PhD.  I, Lenon Kuennen C. Mike Gip, MD, have reviewed the above documentation for accuracy and completeness, and I agree with the above.

## 2019-05-28 ENCOUNTER — Inpatient Hospital Stay: Payer: Medicare Other | Attending: Hematology and Oncology | Admitting: Hematology and Oncology

## 2019-05-28 ENCOUNTER — Other Ambulatory Visit: Payer: Self-pay

## 2019-05-28 ENCOUNTER — Encounter: Payer: Self-pay | Admitting: Hematology and Oncology

## 2019-05-28 DIAGNOSIS — M545 Low back pain: Secondary | ICD-10-CM | POA: Diagnosis not present

## 2019-05-28 DIAGNOSIS — Z7982 Long term (current) use of aspirin: Secondary | ICD-10-CM

## 2019-05-28 DIAGNOSIS — Z79899 Other long term (current) drug therapy: Secondary | ICD-10-CM

## 2019-05-28 DIAGNOSIS — E538 Deficiency of other specified B group vitamins: Secondary | ICD-10-CM

## 2019-05-28 DIAGNOSIS — D509 Iron deficiency anemia, unspecified: Secondary | ICD-10-CM | POA: Diagnosis not present

## 2019-05-28 NOTE — Progress Notes (Signed)
Patient states that Dr Merlene Pulling told her not to drink caffeine and she is wanting to know the reason for that and if she can drink it now

## 2019-06-12 ENCOUNTER — Other Ambulatory Visit: Payer: Self-pay

## 2019-06-13 ENCOUNTER — Inpatient Hospital Stay: Payer: Medicare Other

## 2019-06-13 VITALS — BP 129/64 | HR 90 | Temp 96.0°F | Resp 18

## 2019-06-13 DIAGNOSIS — E538 Deficiency of other specified B group vitamins: Secondary | ICD-10-CM | POA: Diagnosis present

## 2019-06-13 DIAGNOSIS — D509 Iron deficiency anemia, unspecified: Secondary | ICD-10-CM

## 2019-06-13 MED ORDER — CYANOCOBALAMIN 1000 MCG/ML IJ SOLN
1000.0000 ug | Freq: Once | INTRAMUSCULAR | Status: AC
  Administered 2019-06-13: 1000 ug via INTRAMUSCULAR

## 2019-07-15 ENCOUNTER — Inpatient Hospital Stay: Payer: Medicare Other | Attending: Hematology and Oncology

## 2019-07-15 ENCOUNTER — Other Ambulatory Visit: Payer: Self-pay

## 2019-07-15 VITALS — BP 138/72 | HR 87 | Temp 95.0°F | Resp 18

## 2019-07-15 DIAGNOSIS — E538 Deficiency of other specified B group vitamins: Secondary | ICD-10-CM | POA: Diagnosis not present

## 2019-07-15 DIAGNOSIS — D509 Iron deficiency anemia, unspecified: Secondary | ICD-10-CM

## 2019-07-15 MED ORDER — CYANOCOBALAMIN 1000 MCG/ML IJ SOLN
1000.0000 ug | Freq: Once | INTRAMUSCULAR | Status: AC
  Administered 2019-07-15: 09:00:00 1000 ug via INTRAMUSCULAR

## 2019-08-12 ENCOUNTER — Inpatient Hospital Stay: Payer: Medicare Other | Attending: Hematology and Oncology

## 2019-08-12 ENCOUNTER — Inpatient Hospital Stay: Payer: Medicare Other

## 2019-08-12 ENCOUNTER — Other Ambulatory Visit: Payer: Self-pay

## 2019-08-12 VITALS — BP 150/77 | HR 88 | Temp 95.9°F | Resp 18

## 2019-08-12 DIAGNOSIS — E538 Deficiency of other specified B group vitamins: Secondary | ICD-10-CM | POA: Diagnosis not present

## 2019-08-12 DIAGNOSIS — D509 Iron deficiency anemia, unspecified: Secondary | ICD-10-CM

## 2019-08-12 MED ORDER — CYANOCOBALAMIN 1000 MCG/ML IJ SOLN
1000.0000 ug | Freq: Once | INTRAMUSCULAR | Status: AC
  Administered 2019-08-12: 1000 ug via INTRAMUSCULAR

## 2019-09-11 ENCOUNTER — Other Ambulatory Visit: Payer: Self-pay

## 2019-09-11 ENCOUNTER — Ambulatory Visit: Payer: Medicare Other | Admitting: Hematology and Oncology

## 2019-09-11 ENCOUNTER — Inpatient Hospital Stay: Payer: Medicare Other | Attending: Hematology and Oncology

## 2019-09-11 ENCOUNTER — Other Ambulatory Visit: Payer: Medicare Other

## 2019-09-11 VITALS — BP 145/75 | HR 88 | Temp 96.0°F | Resp 18

## 2019-09-11 DIAGNOSIS — D509 Iron deficiency anemia, unspecified: Secondary | ICD-10-CM | POA: Insufficient documentation

## 2019-09-11 DIAGNOSIS — E538 Deficiency of other specified B group vitamins: Secondary | ICD-10-CM | POA: Insufficient documentation

## 2019-09-11 MED ORDER — CYANOCOBALAMIN 1000 MCG/ML IJ SOLN
1000.0000 ug | Freq: Once | INTRAMUSCULAR | Status: AC
  Administered 2019-09-11: 1000 ug via INTRAMUSCULAR
  Filled 2019-09-11: qty 1

## 2019-09-11 NOTE — Patient Instructions (Signed)

## 2019-10-09 NOTE — Progress Notes (Signed)
[facility]  [address] [place], [place] [address] Phone: [phone]  Fax: [phone]   Office Visit:  10/11/2019  Referring physician: Myrene Buddy, *  Chief Complaint: Kirsten Reed is a 86 y.o. female with iron deficiency and B12 deficiency who is seen for 4 month assessment.  HPI: The patient was last seen in the hematology clinic on 05/28/2019 via telephone. At that time, she felt good.  She denied any bleeding.  Hematocrit was 35.1, hemoglobin 11.2, and MCV 87.5. Ferritin was 33.  She remained on oral iron.  She received B-12 monthly (06/13/2019 - 09/11/2019).    During the interim, she is doing good today. She takes oral iron every day; 3 pills a day. She denies any bleeding. She says she doesn't eat a lot. She has to often force herself to eat meat. She states her blood pressure is not where she likes it to be. She tries to eat healthy; she eats a lot of salads. She keeps busy throughout the day. She notes nobody in her family has ever had cancer. She no longer drives and she does not feel good about her husband driving, so she worries how she will get to clinic once a month for her B-12 shots. Her sister-in-law is a Engineer, civil (consulting) and says she can give her the B-12 shots if it is necessary.   Her house is being sold and she will be out by the summer. She will most likely be living in [place].   She has not received the COVID-19 vaccine and is not interested in receiving it because most vaccines she has taken in the past have made her incredibly sick.     Past Medical History:  Diagnosis Date  . Broken wrist   . Restless leg syndrome   . Scoliosis   . Shingles 05/2016  . Wears dentures    full upper    Past Surgical History:  Procedure Laterality Date  . CATARACT EXTRACTION W/PHACO Left 03/13/2018   Procedure: CATARACT EXTRACTION PHACO AND INTRAOCULAR LENS PLACEMENT (IOC) LEFT;  Surgeon: Nevada Crane, MD;  Location: [facility];  Service: Ophthalmology;  Laterality: Left;  . CATARACT EXTRACTION W/PHACO Right 05/28/2018   Procedure: CATARACT EXTRACTION PHACO AND INTRAOCULAR LENS PLACEMENT (IOC) RIGHT;  Surgeon: Nevada Crane, MD;  Location: [facility];  Service: Ophthalmology;  Laterality: Right;    Family History  Problem Relation Age of Onset  . Hypertension Mother   . Diabetes Mother     Social History:  reports that she has never smoked. She has never used smokeless tobacco. She reports that she does not drink alcohol or use drugs. She is retired. 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 Her husband's name is Therapist, nutritional. She lives in [place]. The patient is alone today.   Allergies:  Allergies  Allergen Reactions  . Sulfa Antibiotics     Current Medications: Current Outpatient Medications  Medication Sig Dispense Refill  . Ascorbic Acid (VITAMIN C) 500 MG CAPS Take 1 tablet by mouth daily.    Marland Kitchen aspirin 81 MG chewable tablet Chew 81 mg by mouth as needed.    Marland Kitchen CALCIUM CARBONATE ANTACID PO Take 1 tablet by mouth as needed.    . chlorhexidine (PERIDEX) 0.12 % solution Use as directed 15 mLs in the mouth or throat 2 (two) times daily.    . Cyanocobalamin (VITAMIN B 12 PO) Take 2,000 mg by mouth.    . ferrous  sulfate 325 (65 FE) MG tablet 3 (three) times daily with meals.     Marland Kitchen VITAMIN D PO Take 5,000 Units by mouth.     No current facility-administered medications for this visit.    Review of Systems  Constitutional: Negative.  Negative for chills, diaphoresis, fever, malaise/fatigue and weight loss.       Feels "good".  HENT: Negative.  Negative for congestion, hearing loss, nosebleeds, sinus pain and sore throat.   Eyes: Negative.  Negative for blurred vision, double vision, photophobia and pain.       S/p cataract surgery 04/2018 and 05/2018.  Respiratory: Negative.  Negative for cough, sputum production and shortness of breath.   Cardiovascular: Negative.   Negative for chest pain, palpitations, orthopnea, leg swelling and PND.  Gastrointestinal: Negative.  Negative for abdominal pain, blood in stool, constipation, diarrhea, heartburn, melena, nausea and vomiting.       She hasn't been eating a lot. She eats a lot of salads and often has to force herself to eat meat.   Genitourinary: Negative.  Negative for dysuria, frequency, hematuria and urgency.  Musculoskeletal: Positive for back pain (chronic, lower). Negative for joint pain and myalgias.  Skin: Negative.  Negative for itching and rash.  Neurological: Negative.  Negative for dizziness, tingling, sensory change, focal weakness, weakness and headaches.  Endo/Heme/Allergies: Negative.  Does not bruise/bleed easily.  Psychiatric/Behavioral: Negative.  Negative for depression and memory loss. The patient is not nervous/anxious and does not have insomnia.   All other systems reviewed and are negative.  Performance status (ECOG): 0 - Asymptomatic  Physical Exam  Nursing note reviewed. Constitutional: She is oriented to person, place, and time. She appears well-developed and well-nourished. No distress.  HENT:  Head: Normocephalic and atraumatic.  Mouth/Throat: Oropharynx is clear and moist. No oropharyngeal exudate.  Short white hair.   Eyes: Pupils are equal, round, and reactive to light. Conjunctivae and EOM are normal. Right eye exhibits no discharge. Left eye exhibits no discharge. No scleral icterus.  Glasses.   Neck: No JVD present.  Cardiovascular: Normal rate, regular rhythm, normal heart sounds and intact distal pulses. Exam reveals no gallop and no friction rub.  No murmur heard. Respiratory: Effort normal and breath sounds normal. No respiratory distress. She has no wheezes. She has no rales. She exhibits no tenderness.  GI: Soft. Bowel sounds are normal. She exhibits no distension and no mass. There is no abdominal tenderness. There is no rebound and no guarding.  Musculoskeletal:         General: No tenderness, deformity or edema. Normal range of motion.     Cervical back: Normal range of motion and neck supple.  Lymphadenopathy:       Head (right side): No preauricular, no posterior auricular and no occipital adenopathy present.       Head (left side): No preauricular, no posterior auricular and no occipital adenopathy present.    She has no cervical adenopathy.    She has no axillary adenopathy.       Right: No supraclavicular adenopathy present.       Left: No supraclavicular adenopathy present.  Neurological: She is alert and oriented to person, place, and time.  Skin: Skin is warm and dry. No rash noted. She is not diaphoretic. No erythema. No pallor.  Psychiatric: She has a normal mood and affect. Her behavior is normal. Judgment and thought content normal.    Appointment on 10/11/2019  Component Date Value Ref Range Status  .  WBC 10/11/2019 5.9  4.0 - 10.5 K/uL Final  . RBC 10/11/2019 4.02  3.87 - 5.11 MIL/uL Final  . Hemoglobin 10/11/2019 11.8* 12.0 - 15.0 g/dL Final  . HCT 61/60/7371 36.7  36.0 - 46.0 % Final  . MCV 10/11/2019 91.3  80.0 - 100.0 fL Final  . MCH 10/11/2019 29.4  26.0 - 34.0 pg Final  . MCHC 10/11/2019 32.2  30.0 - 36.0 g/dL Final  . RDW 11/16/9483 13.8  11.5 - 15.5 % Final  . Platelets 10/11/2019 298  150 - 400 K/uL Final  . nRBC 10/11/2019 0.0  0.0 - 0.2 % Final  . Neutrophils Relative % 10/11/2019 55  % Final  . Neutro Abs 10/11/2019 3.2  1.7 - 7.7 K/uL Final  . Lymphocytes Relative 10/11/2019 29  % Final  . Lymphs Abs 10/11/2019 1.7  0.7 - 4.0 K/uL Final  . Monocytes Relative 10/11/2019 11  % Final  . Monocytes Absolute 10/11/2019 0.7  0.1 - 1.0 K/uL Final  . Eosinophils Relative 10/11/2019 4  % Final  . Eosinophils Absolute 10/11/2019 0.3  0.0 - 0.5 K/uL Final  . Basophils Relative 10/11/2019 1  % Final  . Basophils Absolute 10/11/2019 0.0  0.0 - 0.1 K/uL Final  . Immature Granulocytes 10/11/2019 0  % Final  . Abs Immature  Granulocytes 10/11/2019 0.02  0.00 - 0.07 K/uL Final   Performed at [facility], [address], [place], [place] [address]    Assessment:  Kirsten Reed is a 86 y.o. female with iron deficiency anemia. She has had a microcytic anemia dating back to at least 04/2016. Hemoglobinhas ranged between 7.2-8.3. Iron saturationwas4%on 01/16/2019.Dietappears good. She does not eat red meat.   Labs on08/26/2020:hematocrit 26.2, hemoglobin 7.2, MCV 68.3, platelets 386,000, WBC 6,000 (ANC 3500). Iron saturation4% with a TIBC 471.5. TSH 1.723.  Work-upon 09/02/2020revealed a hematocrit of 26.0, hemoglobin 7.5, MCV 69.1, platelets 396,000,WBC 5400 with an ANC of 3100. Retic was 3.3%. Ferritinwas 90 with an iron saturationof14% with a TIBC of 411. LDH was 147. B12was 235 (low). SPEP on 02/04/2019 was negative.Peripheral smearrevealed microcytic hypochromic anemia with mild anisopoikilocytosisincluding rare target cells. There was mild polychromasia, normal leukocyte count and differential and mildly increased platelet count with unremarkable morphology. Findings were suggestive of iron deficiency anemia. Urinalysis revealed no hematuria. Guaiac cards x4 were negative  She is on oral iron. Ferritinhas been followed: 90 on09/06/2018, 41 on09/14/2020, 27 on 02/25/2019, and 33 on 05/10/2019.  She has never had a colonoscopy.  She has B12 deficiency.  She was oral B12 (began 01/23/2019).  B12 was 235 on 01/23/2019 and 146 on 02/25/2019.  She began B12 injections on 02/26/2019 (last 09/11/2019).  Intrinsic factor and anti-parietal antibody were normal on 05/10/2019.  Folate was 27 on 02/10/2019.  She has not received the COVID-19 vaccine and is not interested in receiving it   Symptomatically, she feels good.  She is taking oral iron.  She denies any bleeding.  Exam is stable.  Plan: 1.   Labs today:  CBC with diff, ferritin. 2.Iron deficiency  anemia Clinically, she is doing well. Hemoglobin has improved on oral iron.  Hematocrit 26.0.  Hemoglobin 7.5.    MCV 69.1 on 01/23/2019. Hematocrit 30.8.  Hemoglobin 9.0.    MCV 76.2 on 02/04/2019.                         Hematocrit 33.5.  Hemoglobin 10.2.  MCV 80.1 on 02/25/2019.  Hematocrit 35.1.  Hemoglobin 11.2.  MCV 87.5 on 05/10/2019.   Hematocrit 36.7.  Hemoglobin 11.8.  MCV 91.3 on 10/11/2019.             Urinalysis and guaiac cards revealed no evidence ofGU/GIbleeding.  Oral iron continues. 3.B12 deficiency             B12 was 235 on 01/23/2019 and 146 on 02/25/2019 after oral B12 x 1 month.             B12 goal is 400.  She began B12 injections on 02/26/2019 (last 09/11/2019).            Continue B12 monthly x 6.   Check folate annually. 4.   RTC in 3 months for labs (CBC with diff, ferritin). 5.   RTC in 6 months for MD assessment, labs (CBC with diff, ferritin, iron studies, folate), and B12.  I discussed the assessment and treatment plan with the patient.  The patient was provided an opportunity to ask questions and all were answered.  The patient agreed with the plan and demonstrated an understanding of the instructions.  The patient was advised to call back if the symptoms worsen or if the condition fails to improve as anticipated.  I provided 13 minutes (8:55 AM - 9:08 AM) of face-to-face time during this this encounter and > 50% was spent counseling as documented under my assessment and plan.    Rosey Bath, MD, PhD    10/11/2019, 8:55 AM  I, Wandalee Ferdinand, am acting as a Neurosurgeon for Rosey Bath, MD.  I, Abilene Mcphee C. Merlene Pulling, MD, have reviewed the above documentation for accuracy and completeness, and I agree with the above.

## 2019-10-11 ENCOUNTER — Inpatient Hospital Stay (HOSPITAL_BASED_OUTPATIENT_CLINIC_OR_DEPARTMENT_OTHER): Payer: Medicare Other | Admitting: Hematology and Oncology

## 2019-10-11 ENCOUNTER — Encounter: Payer: Self-pay | Admitting: Hematology and Oncology

## 2019-10-11 ENCOUNTER — Other Ambulatory Visit: Payer: Self-pay

## 2019-10-11 ENCOUNTER — Inpatient Hospital Stay: Payer: Medicare Other

## 2019-10-11 ENCOUNTER — Inpatient Hospital Stay: Payer: Medicare Other | Attending: Hematology and Oncology

## 2019-10-11 VITALS — BP 139/63 | HR 87 | Temp 95.7°F | Resp 18 | Wt 119.0 lb

## 2019-10-11 DIAGNOSIS — D509 Iron deficiency anemia, unspecified: Secondary | ICD-10-CM | POA: Diagnosis present

## 2019-10-11 DIAGNOSIS — E538 Deficiency of other specified B group vitamins: Secondary | ICD-10-CM | POA: Diagnosis present

## 2019-10-11 LAB — CBC WITH DIFFERENTIAL/PLATELET
Abs Immature Granulocytes: 0.02 10*3/uL (ref 0.00–0.07)
Basophils Absolute: 0 10*3/uL (ref 0.0–0.1)
Basophils Relative: 1 %
Eosinophils Absolute: 0.3 10*3/uL (ref 0.0–0.5)
Eosinophils Relative: 4 %
HCT: 36.7 % (ref 36.0–46.0)
Hemoglobin: 11.8 g/dL — ABNORMAL LOW (ref 12.0–15.0)
Immature Granulocytes: 0 %
Lymphocytes Relative: 29 %
Lymphs Abs: 1.7 10*3/uL (ref 0.7–4.0)
MCH: 29.4 pg (ref 26.0–34.0)
MCHC: 32.2 g/dL (ref 30.0–36.0)
MCV: 91.3 fL (ref 80.0–100.0)
Monocytes Absolute: 0.7 10*3/uL (ref 0.1–1.0)
Monocytes Relative: 11 %
Neutro Abs: 3.2 10*3/uL (ref 1.7–7.7)
Neutrophils Relative %: 55 %
Platelets: 298 10*3/uL (ref 150–400)
RBC: 4.02 MIL/uL (ref 3.87–5.11)
RDW: 13.8 % (ref 11.5–15.5)
WBC: 5.9 10*3/uL (ref 4.0–10.5)
nRBC: 0 % (ref 0.0–0.2)

## 2019-10-11 LAB — FERRITIN: Ferritin: 83 ng/mL (ref 11–307)

## 2019-10-11 MED ORDER — CYANOCOBALAMIN 1000 MCG/ML IJ SOLN
1000.0000 ug | Freq: Once | INTRAMUSCULAR | Status: AC
  Administered 2019-10-11: 1000 ug via INTRAMUSCULAR

## 2019-10-11 NOTE — Progress Notes (Signed)
Patient is here for follow up of IDA, on oral iron and B12 injections. She states that she  is a little sad and depressed, and is tearful today because she is being forced to move from her beloved home because of commercial development (*she lives behind [place]) and she is having to sell and give away all of her stuff because she can't take it with her. She has some decreased appetite which she related to being depressed. She doesn't sleep well, but says she has never been a good sleeper. She has no other complaints.

## 2019-11-08 ENCOUNTER — Other Ambulatory Visit: Payer: Self-pay

## 2019-11-08 ENCOUNTER — Inpatient Hospital Stay: Payer: Medicare Other | Attending: Hematology and Oncology

## 2019-11-08 VITALS — BP 148/62 | HR 89 | Temp 96.0°F | Resp 20

## 2019-11-08 DIAGNOSIS — E538 Deficiency of other specified B group vitamins: Secondary | ICD-10-CM | POA: Diagnosis not present

## 2019-11-08 DIAGNOSIS — D509 Iron deficiency anemia, unspecified: Secondary | ICD-10-CM

## 2019-11-08 MED ORDER — CYANOCOBALAMIN 1000 MCG/ML IJ SOLN
1000.0000 ug | Freq: Once | INTRAMUSCULAR | Status: AC
  Administered 2019-11-08: 1000 ug via INTRAMUSCULAR
  Filled 2019-11-08: qty 1

## 2019-12-05 ENCOUNTER — Inpatient Hospital Stay: Payer: Medicare Other | Attending: Nurse Practitioner

## 2019-12-05 ENCOUNTER — Other Ambulatory Visit: Payer: Self-pay

## 2019-12-05 VITALS — BP 141/82

## 2019-12-05 DIAGNOSIS — D509 Iron deficiency anemia, unspecified: Secondary | ICD-10-CM

## 2019-12-05 DIAGNOSIS — E538 Deficiency of other specified B group vitamins: Secondary | ICD-10-CM | POA: Insufficient documentation

## 2019-12-05 MED ORDER — CYANOCOBALAMIN 1000 MCG/ML IJ SOLN
1000.0000 ug | Freq: Once | INTRAMUSCULAR | Status: AC
  Administered 2019-12-05: 1000 ug via INTRAMUSCULAR

## 2020-01-02 ENCOUNTER — Ambulatory Visit: Payer: Medicare Other | Admitting: Hematology and Oncology

## 2020-01-02 ENCOUNTER — Ambulatory Visit: Payer: Medicare Other

## 2020-01-02 ENCOUNTER — Other Ambulatory Visit: Payer: Medicare Other

## 2020-01-03 ENCOUNTER — Ambulatory Visit: Payer: Medicare Other

## 2020-01-03 ENCOUNTER — Other Ambulatory Visit: Payer: Medicare Other

## 2020-01-06 ENCOUNTER — Other Ambulatory Visit: Payer: Self-pay

## 2020-01-06 DIAGNOSIS — D509 Iron deficiency anemia, unspecified: Secondary | ICD-10-CM

## 2020-01-07 NOTE — Progress Notes (Signed)
[facility]  [address], [address] [place], [place] [address] Phone: [phone]  Fax: [phone]   Office Visit:  01/08/2020  Referring physician: Myrene BuddyGauger, Sarah Kathryn, *  Chief Complaint: Cardell PeachCarol M Reed is a 86 y.o. female with iron deficiency and B12 deficiency who is seen for 3 month assessment.  HPI: The patient was last seen in the hematology clinic on 10/11/2019. At that time, she felt good.  She was taking oral iron.  She denied any bleeding.  Exam was stable. Hematocrit was 36.7, hemoglobin 11.8, platelets 298,000, WBC 5,900. Ferritin was 83. She received a Vitamin B12 injection.  She has received B12 injections monthly (10/11/2019 - 12/05/2019).  During the interim, she has been "very well." She states that she "feels like she is 50 or 62." She takes oral iron TID. She does not eat well because she does not have much of an appetite. Her diet consists of pork, chicken, and a lot of vegetables. She does not eat red meat. Her back pain is stable. She states that she does not sleep well and never has.  She would like to stop coming in for Vitamin B12 injections because she does not drive anymore and her husband "should not be driving." She would like to start oral B12 again after her injection in 01/2020.  The patient has never had a colonoscopy and declines one today.   Past Medical History:  Diagnosis Date  . Broken wrist   . Restless leg syndrome   . Scoliosis   . Shingles 05/2016  . Wears dentures    full upper    Past Surgical History:  Procedure Laterality Date  . CATARACT EXTRACTION W/PHACO Left 03/13/2018   Procedure: CATARACT EXTRACTION PHACO AND INTRAOCULAR LENS PLACEMENT (IOC) LEFT;  Surgeon: [place] CraneKing, Bradley Mark, MD;  [facility];  Service: Ophthalmology;  Laterality: Left;  . CATARACT EXTRACTION W/PHACO Right 05/28/2018   Procedure: CATARACT EXTRACTION PHACO AND INTRAOCULAR LENS PLACEMENT (IOC) RIGHT;  Surgeon:  [place] CraneKing, Bradley Mark, MD;  [facility];  Service: Ophthalmology;  Laterality: Right;    Family History  Problem Relation Age of Onset  . Hypertension Mother   . Diabetes Mother     Social History:  reports that she has never smoked. She has never used smokeless tobacco. She reports that she does not drink alcohol and does not use drugs. She is retired. 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 Her husband's name is Therapist, nutritionalBilly. She [place] in [place] but states that a contractor is buying her property. She is moving to [place]. The patient is alone today.   Allergies:  Allergies  Allergen Reactions  . Sulfa Antibiotics     Current Medications: Current Outpatient Medications  Medication Sig Dispense Refill  . Ascorbic Acid (VITAMIN C) 500 MG CAPS Take 1 tablet by mouth daily.    Marland Kitchen. aspirin 81 MG chewable tablet Chew 81 mg by mouth as needed.    Marland Kitchen. CALCIUM CARBONATE ANTACID PO Take 1 tablet by mouth as needed.    . Cyanocobalamin (VITAMIN B 12 PO) Take 2,000 mg by mouth.    . ferrous sulfate 325 (65 FE) MG tablet 3 (three) times daily with meals.     Marland Kitchen. VITAMIN D PO Take 5,000 Units by mouth.    . chlorhexidine (PERIDEX) 0.12 % solution Use as directed 15 mLs in the mouth or throat 2 (two) times daily. (Patient not taking: Reported on 01/08/2020)     No  current facility-administered medications for this visit.   Facility-Administered Medications Ordered in Other Visits  Medication Dose Route Frequency Provider Last Rate Last Admin  . cyanocobalamin ((VITAMIN B-12)) injection 1,000 mcg  1,000 mcg Intramuscular Once Rosey Bath, MD        Review of Systems  Constitutional: Positive for weight loss (1 lb). Negative for chills, diaphoresis, fever and malaise/fatigue.       Feels "very well."  HENT: Negative for congestion, ear discharge, ear pain, hearing loss, nosebleeds, sinus pain, sore throat and tinnitus.   Eyes: Negative.  Negative for blurred  vision, double vision, photophobia and pain.       S/p cataract surgery 04/2018 and 05/2018.  Respiratory: Negative.  Negative for cough, sputum production and shortness of breath.   Cardiovascular: Negative.  Negative for chest pain, palpitations, orthopnea, leg swelling and PND.  Gastrointestinal: Negative for abdominal pain, blood in stool, constipation, diarrhea, heartburn, melena, nausea and vomiting.       Poor appetite. Does not eat red meat.  Genitourinary: Negative.  Negative for dysuria, frequency, hematuria and urgency.  Musculoskeletal: Positive for back pain (chronic, lower). Negative for joint pain and myalgias.  Skin: Negative.  Negative for itching and rash.  Neurological: Negative.  Negative for dizziness, tingling, sensory change, focal weakness, weakness and headaches.  Endo/Heme/Allergies: Negative.  Does not bruise/bleed easily.  Psychiatric/Behavioral: Negative for depression and memory loss. The patient has insomnia. The patient is not nervous/anxious.   All other systems reviewed and are negative.  Performance status (ECOG): 0 - Asymptomatic  Vital Signs Blood pressure (!) 147/54, pulse 92, temperature 97.7 F (36.5 C), temperature source Tympanic, resp. rate 18, height 4\' 11"  (1.499 m), weight 118 lb 9.7 oz (53.8 kg), SpO2 98 %.   Physical Exam Vitals and nursing note reviewed.  Constitutional:      General: She is not in acute distress.    Appearance: She is well-developed. She is not diaphoretic.  HENT:     Head: Normocephalic and atraumatic.     Comments: Short white hair.    Mouth/Throat:     Pharynx: No oropharyngeal exudate.  Eyes:     General: No scleral icterus.       Right eye: No discharge.        Left eye: No discharge.     Conjunctiva/sclera: Conjunctivae normal.     Pupils: Pupils are equal, round, and reactive to light.     Comments: Glasses.  Blue eyes.   Neck:     Vascular: No JVD.  Cardiovascular:     Rate and Rhythm: Normal rate and  regular rhythm.     Heart sounds: Normal heart sounds. No murmur heard.  No friction rub. No gallop.   Pulmonary:     Effort: Pulmonary effort is normal. No respiratory distress.     Breath sounds: Normal breath sounds. No wheezing or rales.  Chest:     Chest wall: No tenderness.  Abdominal:     General: Bowel sounds are normal. There is no distension.     Palpations: Abdomen is soft. There is no hepatomegaly, splenomegaly or mass.     Tenderness: There is no abdominal tenderness. There is no guarding or rebound.  Musculoskeletal:        General: No tenderness or deformity. Normal range of motion.     Cervical back: Normal range of motion and neck supple.  Lymphadenopathy:     Head:     Right side of head: No  preauricular, posterior auricular or occipital adenopathy.     Left side of head: No preauricular, posterior auricular or occipital adenopathy.     Cervical: No cervical adenopathy.     Upper Body:     Right upper body: No supraclavicular or axillary adenopathy.     Left upper body: No supraclavicular or axillary adenopathy.     Lower Body: No right inguinal adenopathy. No left inguinal adenopathy.  Skin:    General: Skin is warm and dry.     Coloration: Skin is not pale.     Findings: No erythema or rash.  Neurological:     Mental Status: She is alert and oriented to person, place, and time.  Psychiatric:        Behavior: Behavior normal.        Thought Content: Thought content normal.        Judgment: Judgment normal.    Appointment on 01/08/2020  Component Date Value Ref Range Status  . WBC 01/08/2020 7.0  4.0 - 10.5 K/uL Final  . RBC 01/08/2020 4.00  3.87 - 5.11 MIL/uL Final  . Hemoglobin 01/08/2020 11.5* 12.0 - 15.0 g/dL Final  . HCT 17/49/4496 36.6  36 - 46 % Final  . MCV 01/08/2020 91.5  80.0 - 100.0 fL Final  . MCH 01/08/2020 28.8  26.0 - 34.0 pg Final  . MCHC 01/08/2020 31.4  30.0 - 36.0 g/dL Final  . RDW 75/91/6384 14.1  11.5 - 15.5 % Final  . Platelets  01/08/2020 310  150 - 400 K/uL Final  . nRBC 01/08/2020 0.0  0.0 - 0.2 % Final  . Neutrophils Relative % 01/08/2020 68  % Final  . Neutro Abs 01/08/2020 4.7  1.7 - 7.7 K/uL Final  . Lymphocytes Relative 01/08/2020 18  % Final  . Lymphs Abs 01/08/2020 1.3  0.7 - 4.0 K/uL Final  . Monocytes Relative 01/08/2020 9  % Final  . Monocytes Absolute 01/08/2020 0.7  0 - 1 K/uL Final  . Eosinophils Relative 01/08/2020 4  % Final  . Eosinophils Absolute 01/08/2020 0.3  0 - 0 K/uL Final  . Basophils Relative 01/08/2020 1  % Final  . Basophils Absolute 01/08/2020 0.1  0 - 0 K/uL Final  . Immature Granulocytes 01/08/2020 0  % Final  . Abs Immature Granulocytes 01/08/2020 0.03  0.00 - 0.07 K/uL Final   Performed at [facility], [address], [place], [place] [address]    Assessment:  Kirsten Reed is a 86 y.o. female with iron deficiency anemia. She has had a microcytic anemia dating back to at least 04/2016. Hemoglobinhas ranged between 7.2-8.3. Iron saturationwas4%on 01/16/2019.Dietappears good. She does not eat red meat.   Labs on08/26/2020:hematocrit 26.2, hemoglobin 7.2, MCV 68.3, platelets 386,000, WBC 6,000 (ANC 3500). Iron saturation4% with a TIBC 471.5. TSH 1.723.  Work-upon 09/02/2020revealed a hematocrit of 26.0, hemoglobin 7.5, MCV 69.1, platelets 396,000,WBC 5400 with an ANC of 3100. Retic was 3.3%. Ferritinwas 90 with an iron saturationof14% with a TIBC of 411. LDH was 147. B12was 235 (low). SPEP on 02/04/2019 was negative.Peripheral smearrevealed microcytic hypochromic anemia with mild anisopoikilocytosisincluding rare target cells. There was mild polychromasia, normal leukocyte count and differential and mildly increased platelet count with unremarkable morphology. Findings were suggestive of iron deficiency anemia. Urinalysis revealed no hematuria. Guaiac cards x4 were negative  She is on oral iron. Ferritinhas been followed: 90  on09/06/2018, 41 on09/14/2020, 27 on 02/25/2019, 33 on 05/10/2019, 83 on 10/11/2019 and 127 on 01/08/2020.  She has never had a colonoscopy.  She has B12 deficiency.  She was oral B12 (began 01/23/2019).  B12 was 235 on 01/23/2019 and 146 on 02/25/2019.  She began B12 injections on 02/26/2019 (last 12/05/2019).  Intrinsic factor and anti-parietal antibody were normal on 05/10/2019.  Folate was 27 on 02/10/2019.  She has not received the COVID-19 vaccine and is not interested in receiving it.  Symptomatically, she is doing well.  She is taking oral iron.  Exam is stable.  She did declines colonoscopy.  Plan: 1.   Labs today:  CBC with diff, ferritin, iron studies, folate 2.Iron deficiency anemia Clinically, he continues to do well. Hemoglobin has improved on oral iron.  Hematocrit 26.0.  Hemoglobin   7.5.  MCV 69.1 on 01/23/2019.   Hematocrit 35.1.  Hemoglobin 11.2.  MCV 87.5 on 05/10/2019.   Hematocrit 36.7.  Hemoglobin 11.8.  MCV 91.3 on 10/11/2019.   Hematocrit 36.6.  Hemoglobin 11.5.  MCV 91.5 on 01/08/2020.             Urinalysis and guaiac cards were negative.  Patient declines colonoscopy.  Ferritin goal 100.    Continue oral iron. 3.B12 deficiency             B12 was 235 on 01/23/2019 and 146 on 02/25/2019 after 1 month of oral B12.             B12 goal is 400.  She began B12 injections on 02/26/2019 (last 09/11/2019).           Folate was 22 on 01/08/2020.  Patient would like to do home B12 injections 4.   RN to call patient with ferritin level.  If ferritin >= 100, discontinue oral iron. 5.   B12 today and monthly until moves. 6.   Patient moving out of area.   7.   RTC as needed.  Addendum: Ferritin was 127 (available after clinic).  Once oral iron is discontinued, would follow CBC closely to ensure that it does not drift down.  I discussed the assessment and treatment plan with the patient.  The patient was  provided an opportunity to ask questions and all were answered.  The patient agreed with the plan and demonstrated an understanding of the instructions.  The patient was advised to call back if the symptoms worsen or if the condition fails to improve as anticipated.  I provided 15 minutes of face-to-face time during this encounter and > 50% was spent counseling as documented under my assessment and plan.    Rosey Bath, MD, PhD    01/08/2020, 1:43 PM  I, Jerilee Field, am acting as a Neurosurgeon for Rosey Bath, MD.  I, Heavyn Yearsley C. Merlene Pulling, MD, have reviewed the above documentation for accuracy and completeness, and I agree with the above.

## 2020-01-08 ENCOUNTER — Encounter: Payer: Self-pay | Admitting: Hematology and Oncology

## 2020-01-08 ENCOUNTER — Inpatient Hospital Stay (HOSPITAL_BASED_OUTPATIENT_CLINIC_OR_DEPARTMENT_OTHER): Payer: Medicare Other | Admitting: Hematology and Oncology

## 2020-01-08 ENCOUNTER — Inpatient Hospital Stay: Payer: Medicare Other

## 2020-01-08 ENCOUNTER — Inpatient Hospital Stay: Payer: Medicare Other | Attending: Hematology and Oncology

## 2020-01-08 ENCOUNTER — Other Ambulatory Visit: Payer: Self-pay

## 2020-01-08 VITALS — BP 147/54 | HR 92 | Temp 97.7°F | Resp 18 | Ht 59.0 in | Wt 118.6 lb

## 2020-01-08 DIAGNOSIS — E538 Deficiency of other specified B group vitamins: Secondary | ICD-10-CM | POA: Insufficient documentation

## 2020-01-08 DIAGNOSIS — D509 Iron deficiency anemia, unspecified: Secondary | ICD-10-CM

## 2020-01-08 LAB — CBC WITH DIFFERENTIAL/PLATELET
Abs Immature Granulocytes: 0.03 10*3/uL (ref 0.00–0.07)
Basophils Absolute: 0.1 10*3/uL (ref 0.0–0.1)
Basophils Relative: 1 %
Eosinophils Absolute: 0.3 10*3/uL (ref 0.0–0.5)
Eosinophils Relative: 4 %
HCT: 36.6 % (ref 36.0–46.0)
Hemoglobin: 11.5 g/dL — ABNORMAL LOW (ref 12.0–15.0)
Immature Granulocytes: 0 %
Lymphocytes Relative: 18 %
Lymphs Abs: 1.3 10*3/uL (ref 0.7–4.0)
MCH: 28.8 pg (ref 26.0–34.0)
MCHC: 31.4 g/dL (ref 30.0–36.0)
MCV: 91.5 fL (ref 80.0–100.0)
Monocytes Absolute: 0.7 10*3/uL (ref 0.1–1.0)
Monocytes Relative: 9 %
Neutro Abs: 4.7 10*3/uL (ref 1.7–7.7)
Neutrophils Relative %: 68 %
Platelets: 310 10*3/uL (ref 150–400)
RBC: 4 MIL/uL (ref 3.87–5.11)
RDW: 14.1 % (ref 11.5–15.5)
WBC: 7 10*3/uL (ref 4.0–10.5)
nRBC: 0 % (ref 0.0–0.2)

## 2020-01-08 LAB — FOLATE: Folate: 22 ng/mL (ref >5.9)

## 2020-01-08 LAB — FERRITIN: Ferritin: 127 ng/mL (ref 11–307)

## 2020-01-08 MED ORDER — CYANOCOBALAMIN 1000 MCG/ML IJ SOLN
INTRAMUSCULAR | Status: AC
  Filled 2020-01-08: qty 1

## 2020-01-08 MED ORDER — CYANOCOBALAMIN 1000 MCG/ML IJ SOLN
1000.0000 ug | Freq: Once | INTRAMUSCULAR | Status: AC
  Administered 2020-01-08: 1000 ug via INTRAMUSCULAR

## 2020-01-08 NOTE — Progress Notes (Signed)
No new changes noted today 

## 2020-01-31 ENCOUNTER — Ambulatory Visit: Payer: Medicare Other

## 2020-02-10 ENCOUNTER — Other Ambulatory Visit: Payer: Self-pay

## 2020-02-10 ENCOUNTER — Inpatient Hospital Stay: Payer: Medicare Other | Attending: Hematology and Oncology

## 2020-02-10 DIAGNOSIS — E538 Deficiency of other specified B group vitamins: Secondary | ICD-10-CM | POA: Insufficient documentation

## 2020-02-10 DIAGNOSIS — D509 Iron deficiency anemia, unspecified: Secondary | ICD-10-CM

## 2020-02-10 MED ORDER — CYANOCOBALAMIN 1000 MCG/ML IJ SOLN
1000.0000 ug | Freq: Once | INTRAMUSCULAR | Status: AC
  Administered 2020-02-10: 1000 ug via INTRAMUSCULAR
  Filled 2020-02-10: qty 1

## 2020-02-28 ENCOUNTER — Ambulatory Visit: Payer: Medicare Other

## 2020-03-09 ENCOUNTER — Other Ambulatory Visit: Payer: Self-pay

## 2020-03-09 ENCOUNTER — Inpatient Hospital Stay: Payer: Medicare Other | Attending: Hematology and Oncology

## 2020-03-09 VITALS — BP 139/78

## 2020-03-09 DIAGNOSIS — E538 Deficiency of other specified B group vitamins: Secondary | ICD-10-CM | POA: Insufficient documentation

## 2020-03-09 DIAGNOSIS — D509 Iron deficiency anemia, unspecified: Secondary | ICD-10-CM

## 2020-03-09 MED ORDER — CYANOCOBALAMIN 1000 MCG/ML IJ SOLN
1000.0000 ug | Freq: Once | INTRAMUSCULAR | Status: AC
  Administered 2020-03-09: 1000 ug via INTRAMUSCULAR
  Filled 2020-03-09: qty 1

## 2020-04-13 ENCOUNTER — Other Ambulatory Visit: Payer: Medicare Other

## 2020-04-13 ENCOUNTER — Other Ambulatory Visit: Payer: Self-pay

## 2020-04-13 ENCOUNTER — Ambulatory Visit: Payer: Medicare Other

## 2020-04-13 ENCOUNTER — Inpatient Hospital Stay: Payer: Medicare Other | Attending: Hematology and Oncology

## 2020-04-13 ENCOUNTER — Ambulatory Visit: Payer: Medicare Other | Admitting: Hematology and Oncology

## 2020-04-13 DIAGNOSIS — D509 Iron deficiency anemia, unspecified: Secondary | ICD-10-CM

## 2020-04-13 DIAGNOSIS — E538 Deficiency of other specified B group vitamins: Secondary | ICD-10-CM | POA: Insufficient documentation

## 2020-04-13 MED ORDER — CYANOCOBALAMIN 1000 MCG/ML IJ SOLN
INTRAMUSCULAR | Status: AC
  Filled 2020-04-13: qty 1

## 2020-04-13 MED ORDER — CYANOCOBALAMIN 1000 MCG/ML IJ SOLN
1000.0000 ug | Freq: Once | INTRAMUSCULAR | Status: AC
  Administered 2020-04-13: 1000 ug via INTRAMUSCULAR

## 2020-05-11 ENCOUNTER — Other Ambulatory Visit: Payer: Self-pay

## 2020-05-11 ENCOUNTER — Inpatient Hospital Stay: Payer: Medicare Other | Attending: Hematology and Oncology

## 2020-05-11 VITALS — BP 142/80 | HR 86 | Resp 18

## 2020-05-11 DIAGNOSIS — E538 Deficiency of other specified B group vitamins: Secondary | ICD-10-CM | POA: Insufficient documentation

## 2020-05-11 DIAGNOSIS — D509 Iron deficiency anemia, unspecified: Secondary | ICD-10-CM

## 2020-05-11 MED ORDER — CYANOCOBALAMIN 1000 MCG/ML IJ SOLN
1000.0000 ug | Freq: Once | INTRAMUSCULAR | Status: AC
  Administered 2020-05-11: 14:00:00 1000 ug via INTRAMUSCULAR

## 2020-06-08 ENCOUNTER — Inpatient Hospital Stay: Payer: Medicare Other

## 2020-07-09 ENCOUNTER — Other Ambulatory Visit: Payer: Self-pay

## 2020-07-09 DIAGNOSIS — D509 Iron deficiency anemia, unspecified: Secondary | ICD-10-CM

## 2020-07-13 ENCOUNTER — Inpatient Hospital Stay: Payer: Medicare Other | Attending: Hematology and Oncology

## 2020-07-13 ENCOUNTER — Inpatient Hospital Stay: Payer: Medicare Other

## 2020-07-13 ENCOUNTER — Other Ambulatory Visit: Payer: Self-pay

## 2020-07-13 DIAGNOSIS — E538 Deficiency of other specified B group vitamins: Secondary | ICD-10-CM | POA: Insufficient documentation

## 2020-07-13 DIAGNOSIS — D509 Iron deficiency anemia, unspecified: Secondary | ICD-10-CM

## 2020-07-13 LAB — FERRITIN: Ferritin: 114 ng/mL (ref 11–307)

## 2020-07-13 LAB — CBC
HCT: 38.4 % (ref 36.0–46.0)
Hemoglobin: 12.2 g/dL (ref 12.0–15.0)
MCH: 28.6 pg (ref 26.0–34.0)
MCHC: 31.8 g/dL (ref 30.0–36.0)
MCV: 89.9 fL (ref 80.0–100.0)
Platelets: 298 10*3/uL (ref 150–400)
RBC: 4.27 MIL/uL (ref 3.87–5.11)
RDW: 13.2 % (ref 11.5–15.5)
WBC: 6.3 10*3/uL (ref 4.0–10.5)
nRBC: 0 % (ref 0.0–0.2)

## 2020-07-13 MED ORDER — CYANOCOBALAMIN 1000 MCG/ML IJ SOLN
1000.0000 ug | Freq: Once | INTRAMUSCULAR | Status: AC
  Administered 2020-07-13: 1000 ug via INTRAMUSCULAR
  Filled 2020-07-13: qty 1

## 2020-08-10 ENCOUNTER — Inpatient Hospital Stay: Payer: Medicare Other | Attending: Hematology and Oncology

## 2020-08-10 ENCOUNTER — Inpatient Hospital Stay: Payer: Medicare Other

## 2020-09-10 ENCOUNTER — Inpatient Hospital Stay: Payer: Medicare Other

## 2020-09-10 ENCOUNTER — Other Ambulatory Visit: Payer: Self-pay

## 2020-09-10 ENCOUNTER — Inpatient Hospital Stay: Payer: Medicare Other | Attending: Hematology and Oncology

## 2020-09-10 DIAGNOSIS — E538 Deficiency of other specified B group vitamins: Secondary | ICD-10-CM | POA: Diagnosis present

## 2020-09-10 DIAGNOSIS — D509 Iron deficiency anemia, unspecified: Secondary | ICD-10-CM

## 2020-09-10 MED ORDER — CYANOCOBALAMIN 1000 MCG/ML IJ SOLN
1000.0000 ug | Freq: Once | INTRAMUSCULAR | Status: AC
  Administered 2020-09-10: 1000 ug via INTRAMUSCULAR
  Filled 2020-09-10: qty 1

## 2020-10-05 ENCOUNTER — Inpatient Hospital Stay: Payer: Medicare Other

## 2020-10-12 ENCOUNTER — Inpatient Hospital Stay: Payer: Medicare Other | Attending: Hematology and Oncology

## 2020-10-12 ENCOUNTER — Inpatient Hospital Stay: Payer: Medicare Other

## 2020-11-10 ENCOUNTER — Inpatient Hospital Stay: Payer: Medicare Other

## 2020-12-07 ENCOUNTER — Other Ambulatory Visit: Payer: Medicare Other

## 2020-12-07 ENCOUNTER — Ambulatory Visit: Payer: Medicare Other

## 2021-08-06 ENCOUNTER — Encounter: Payer: Self-pay | Admitting: Hematology and Oncology

## 2021-08-06 ENCOUNTER — Other Ambulatory Visit: Payer: Self-pay

## 2021-08-06 ENCOUNTER — Emergency Department: Payer: Medicare Other

## 2021-08-06 ENCOUNTER — Emergency Department
Admission: EM | Admit: 2021-08-06 | Discharge: 2021-08-06 | Disposition: A | Discharge status: Home / Self Care | Payer: Medicare Other | Attending: Student in an Organized Health Care Education/Training Program | Admitting: Student in an Organized Health Care Education/Training Program

## 2021-08-06 DIAGNOSIS — W010XXA Fall on same level from slipping, tripping and stumbling without subsequent striking against object, initial encounter: Secondary | ICD-10-CM | POA: Insufficient documentation

## 2021-08-06 DIAGNOSIS — S42291A Other displaced fracture of upper end of right humerus, initial encounter for closed fracture: Secondary | ICD-10-CM | POA: Diagnosis not present

## 2021-08-06 DIAGNOSIS — S4991XA Unspecified injury of right shoulder and upper arm, initial encounter: Secondary | ICD-10-CM | POA: Diagnosis present

## 2021-08-06 LAB — CBC WITH DIFFERENTIAL/PLATELET
Abs Immature Granulocytes: 0.03 10*3/uL (ref 0.00–0.07)
Basophils Absolute: 0 10*3/uL (ref 0.0–0.1)
Basophils Relative: 0 %
Eosinophils Absolute: 0.2 10*3/uL (ref 0.0–0.5)
Eosinophils Relative: 2 %
HCT: 35.3 % — ABNORMAL LOW (ref 36.0–46.0)
Hemoglobin: 10.9 g/dL — ABNORMAL LOW (ref 12.0–15.0)
Immature Granulocytes: 0 %
Lymphocytes Relative: 11 %
Lymphs Abs: 1.1 10*3/uL (ref 0.7–4.0)
MCH: 26 pg (ref 26.0–34.0)
MCHC: 30.9 g/dL (ref 30.0–36.0)
MCV: 84 fL (ref 80.0–100.0)
Monocytes Absolute: 0.8 10*3/uL (ref 0.1–1.0)
Monocytes Relative: 8 %
Neutro Abs: 8.2 10*3/uL — ABNORMAL HIGH (ref 1.7–7.7)
Neutrophils Relative %: 79 %
Platelets: 291 10*3/uL (ref 150–400)
RBC: 4.2 MIL/uL (ref 3.87–5.11)
RDW: 15.8 % — ABNORMAL HIGH (ref 11.5–15.5)
WBC: 10.3 10*3/uL (ref 4.0–10.5)
nRBC: 0 % (ref 0.0–0.2)

## 2021-08-06 LAB — BASIC METABOLIC PANEL
Anion gap: 10 (ref 5–15)
BUN: 16 mg/dL (ref 8–23)
CO2: 26 mmol/L (ref 22–32)
Calcium: 9.3 mg/dL (ref 8.9–10.3)
Chloride: 103 mmol/L (ref 98–111)
Creatinine, Ser: 0.7 mg/dL (ref 0.44–1.00)
GFR, Estimated: >60 mL/min (ref >60)
Glucose, Bld: 106 mg/dL — ABNORMAL HIGH (ref 70–99)
Potassium: 4.6 mmol/L (ref 3.5–5.1)
Sodium: 139 mmol/L (ref 135–145)

## 2021-08-06 MED ORDER — OXYCODONE-ACETAMINOPHEN 5-325 MG PO TABS: 1.0000 | ORAL_TABLET | Freq: Three times a day (TID) | ORAL | 0 refills | Status: AC | PRN

## 2021-08-06 MED ORDER — ONDANSETRON HCL 4 MG/2ML IJ SOLN
4.0000 mg | Freq: Once | INTRAMUSCULAR | Status: AC
  Administered 2021-08-06: 4 mg via INTRAVENOUS
  Filled 2021-08-06: qty 2

## 2021-08-06 MED ORDER — FENTANYL CITRATE PF 50 MCG/ML IJ SOSY
50.0000 ug | PREFILLED_SYRINGE | Freq: Once | INTRAMUSCULAR | Status: AC
  Administered 2021-08-06: 50 ug via INTRAVENOUS
  Filled 2021-08-06: qty 1

## 2021-08-06 MED ORDER — ONDANSETRON 4 MG PO TBDP: 4.0000 mg | ORAL_TABLET | Freq: Three times a day (TID) | ORAL | 0 refills | Status: AC | PRN

## 2021-08-06 NOTE — ED Notes (Signed)
Pt to ED POV for fall and shoulder injury, pt states she fell out of a chair after trying to get up to use phone.  ?Pt states she fell on her Right shoulder, pt unable to move arm freely.  ? ?Pt is A&Ox4 ?

## 2021-08-06 NOTE — Discharge Instructions (Signed)
You should wear the shoulder immobilizer and sling for comfort and support.  Apply ice ti reduce pin and swelling. Take the pain medicines as needed.  You may take over-the-counter Tylenol (acetaminophen) as needed for nondrowsy pain relief.  Follow-up with Dr. Joice Lofts further fracture management. ?

## 2021-08-06 NOTE — ED Triage Notes (Signed)
Patient to ER via POV with right shoulder injury. States she was sewing a quilt, stood up to answer her telephone, and fell directly onto her right shoulder. Denies other injury. Denies LOC or blood thinner usage.  ? ?Obvious deformity noted. Patient unable to lift extremity, is able to wiggle fingers, 2+ radial pulse.  ?

## 2021-08-06 NOTE — ED Provider Notes (Signed)
? ? ?[facility] ?Emergency Department Provider Note ? ? ? ? Event Date/Time  ? First MD Initiated Contact with Patient 08/06/21 1310   ?  (approximate) ? ? ?History  ? ?Shoulder Injury ? ? ?HPI ? ?Kirsten Reed is a 88 y.o. female with history of anemia and B12 deficiency, presents to the ED for evaluation of injury sustained following a mechanical fall.  Patient reports she was still on a quilt, when she stepped as to the phone when she apparently tripped and fell landing onto her right shoulder.  She denies any head injury or LOC.  Patient also denies any current blood thinner use.  She presents to the ED via personal vehicle accompanied by her adult son, with pain and disability as well as obvious deformity to the right shoulder. ?  ? ? ?Physical Exam  ? ?Triage Vital Signs: ?ED Triage Vitals  ?Enc Vitals Group  ?   BP 08/06/21 1303 (!) 152/75  ?   Pulse Rate 08/06/21 1303 97  ?   Resp 08/06/21 1303 16  ?   Temp 08/06/21 1304 (!) 97.5 ?F (36.4 ?C)  ?   Temp Source 08/06/21 1304 Oral  ?   SpO2 08/06/21 1303 97 %  ?   Weight 08/06/21 1301 112 lb (50.8 kg)  ?   Height 08/06/21 1301 4\' 11"  (1.499 m)  ?   Head Circumference --   ?   Peak Flow --   ?   Pain Score 08/06/21 1301 10  ?   Pain Loc --   ?   Pain Edu? --   ?   Excl. in GC? --   ? ? ?Most recent vital signs: ?Vitals:  ? 08/06/21 1445 08/06/21 1606  ?BP: (!) 153/57 (!) 141/73  ?Pulse: 99 98  ?Resp: 19 20  ?Temp:    ?SpO2: 100% 98%  ? ? ?General Awake, no distress.  ?HEENT NCAT. PERRL. EOMI. No rhinorrhea. Mucous membranes are moist.  ?CV:  Good peripheral perfusion. RRR ?RESP:  Normal effort. CTA ?ABD:  No distention. Soft, nonteder ?MSK:  Right shoulder and arm held in abducted position.  Patient with pain localized to the proximal shoulder.  Normal composite fist distally.  No skin bruising, ecchymosis, or tenting noted. ? ? ?ED Results / Procedures / Treatments  ? ?Labs ?(all labs ordered are listed, but only abnormal results are  displayed) ?Labs Reviewed  ?BASIC METABOLIC PANEL - Abnormal; Notable for the following components:  ?    Result Value  ? Glucose, Bld 106 (*)   ? All other components within normal limits  ?CBC WITH DIFFERENTIAL/PLATELET - Abnormal; Notable for the following components:  ? Hemoglobin 10.9 (*)   ? HCT 35.3 (*)   ? RDW 15.8 (*)   ? Neutro Abs 8.2 (*)   ? All other components within normal limits  ? ? ? ?EKG ? ? ?RADIOLOGY ? ?I personally viewed and evaluated these images as part of my medical decision making, as well as reviewing the written report by the radiologist. ? ?ED Provider Interpretation: comminuted humeral head fracture without dislocation} ? ?DG Shoulder Right ? ?Result Date: 08/06/2021 ?CLINICAL DATA:  Fall directly onto right shoulder EXAM: RIGHT SHOULDER - 2+ VIEW COMPARISON:  None. FINDINGS: Mildly impacted and comminuted fracture of the right humeral head and neck. Atherosclerotic calcifications noted in the thoracic aorta. IMPRESSION: Mildly impacted and comminuted fracture of the right humeral head and neck. Electronically Signed   By: 08/08/2021  Mir M.D.   On: 08/06/2021 13:56   ? ? ?PROCEDURES: ? ?Critical Care performed: No ? ?Procedures ? ? ?MEDICATIONS ORDERED IN ED: ?Medications  ?ondansetron (ZOFRAN) injection 4 mg (4 mg Intravenous Given 08/06/21 1515)  ?fentaNYL (SUBLIMAZE) injection 50 mcg (50 mcg Intravenous Given 08/06/21 1517)  ? ? ? ?IMPRESSION / MDM / ASSESSMENT AND PLAN / ED COURSE  ?I reviewed the triage vital signs and the nursing notes. ?             ?               ? ?Differential diagnosis includes, but is not limited to, shoulder dislocation, humeral fracture, AC separation, shoulder strain ? ?The patient is on the cardiac monitor to evaluate for evidence of arrhythmia and/or significant heart rate changes. ? ?Geriatric patient to the ED for evaluation of acute right shoulder pain and disability following mechanical fall.  Patient presents in no acute distress with significant pain  to the right shoulder patient reports mechanical fall just prior to arrival.  Denies any head injury or LOC.  Patient's evaluated for complaints in ED, found to have x-ray evidence of a comminuted humeral head fracture without dislocation.  Basic labs in the ED are normal at this time.  Patient is alert and oriented throughout her course in the ED.  Patient will be placed in appropriate shoulder immobilizer.  I discussed the case with Dr. Joice Lofts, who reviewed images and suggested shoulder immobilizer for for nonsurgical fracture care.  He will see the patient in the office next week for interim evaluation.  Patient's diagnosis is consistent with closed nondisplaced humeral head fracture. Patient will be discharged home with prescriptions for Percocet and hydrocodone. Patient is to follow up with Ortho as needed or otherwise directed. Patient is given ED precautions to return to the ED for any worsening or new symptoms. ? ? ?FINAL CLINICAL IMPRESSION(S) / ED DIAGNOSES  ? ?Final diagnoses:  ?Humeral head fracture, right, closed, initial encounter  ? ? ? ?Rx / DC Orders  ? ?ED Discharge Orders   ? ?      Ordered  ?  ondansetron (ZOFRAN-ODT) 4 MG disintegrating tablet  Every 8 hours PRN       ? 08/06/21 1405  ?  oxyCODONE-acetaminophen (PERCOCET) 5-325 MG tablet  Every 8 hours PRN       ? 08/06/21 1405  ? ?  ?  ? ?  ? ? ? ?Note:  This document was prepared using Dragon voice recognition software and may include unintentional dictation errors. ? ?  ?Lissa Hoard, PA-C ?08/06/21 1945 ? ?  ?Willy Eddy, MD ?08/07/21 1011 ? ?

## 2021-08-31 ENCOUNTER — Other Ambulatory Visit: Payer: Self-pay | Admitting: Orthopedic Surgery

## 2021-08-31 DIAGNOSIS — S42201D Unspecified fracture of upper end of right humerus, subsequent encounter for fracture with routine healing: Secondary | ICD-10-CM

## 2021-09-07 ENCOUNTER — Encounter: Payer: Self-pay | Admitting: Hematology and Oncology

## 2021-09-08 ENCOUNTER — Ambulatory Visit
Admission: RE | Admit: 2021-09-08 | Discharge: 2021-09-08 | Disposition: A | Discharge status: Home / Self Care | Payer: Medicare Other | Source: Ambulatory Visit | Attending: Orthopedic Surgery | Admitting: Orthopedic Surgery

## 2021-09-08 DIAGNOSIS — S42201D Unspecified fracture of upper end of right humerus, subsequent encounter for fracture with routine healing: Secondary | ICD-10-CM | POA: Insufficient documentation

## 2022-07-27 IMAGING — MR MR SHOULDER*R* W/O CM
4 of 5 series · 31 of 40 positions shown · non-contrast
Comparison: Right shoulder radiographs 08/06/2021

CLINICAL DATA: Lateral right arm pain at level of humeral head.
Fell 4 weeks ago landing on right side. Mildly impacted and
comminuted fracture of the right humeral head and neck.

EXAM:
MRI OF THE RIGHT SHOULDER WITHOUT CONTRAST
TECHNIQUE: Multiplanar, multisequence MR imaging of the shoulder was performed.
No intravenous contrast was administered.

[Series 5: T2 fat-sat · axial · right · 4.0mm · 0.44mm/px · z∈[-56,+64]mm · 8 of 26 slices shown (1 of 3)]
[im 1/26]
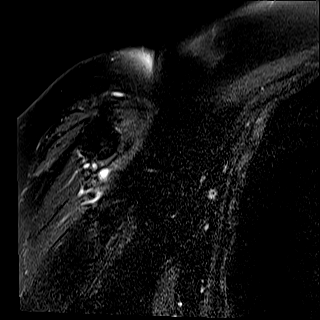
[im 4/26]
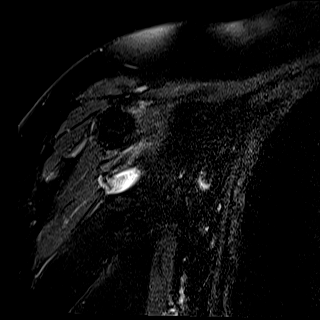
[im 8/26]
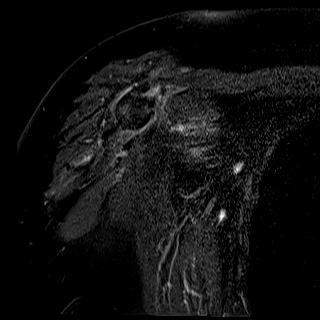
[im 11/26]
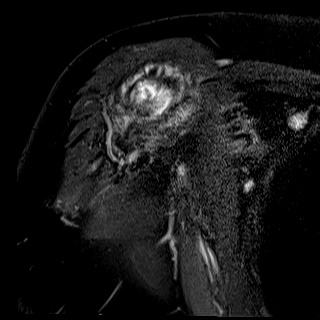
[im 15/26]
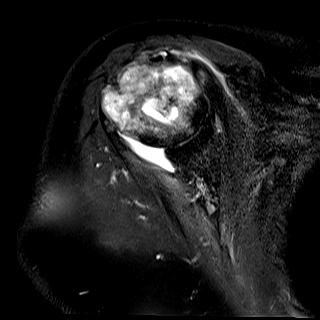
[im 18/26]
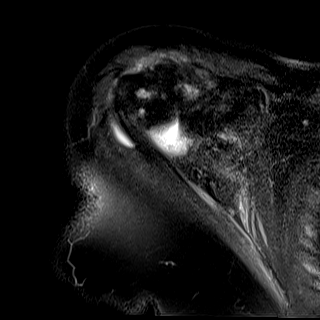
[im 22/26]
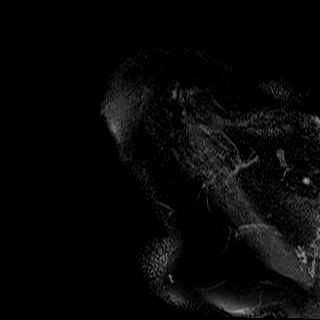
[im 26/26]
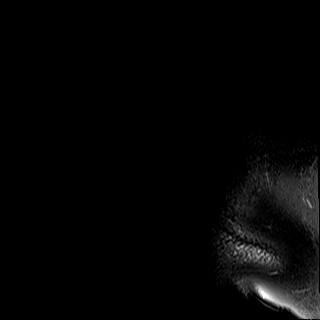

[Series 6: PD · sagittal · right · 4.0mm · 0.44mm/px · 9 of 26 slices shown]
[im 1/26]
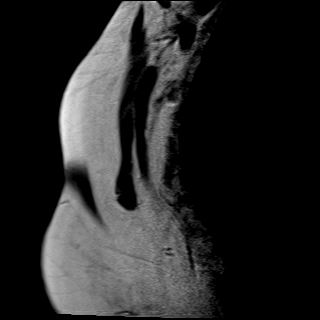
[im 4/26]
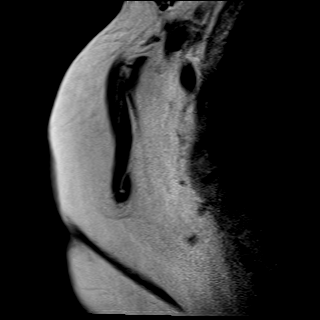
[im 7/26]
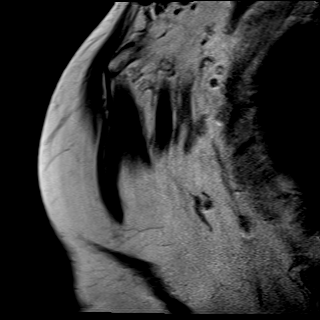
[im 10/26]
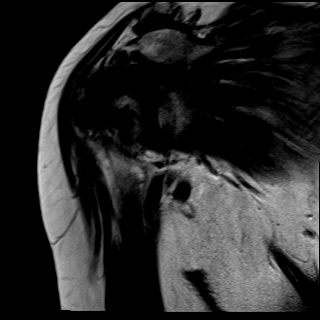
[im 13/26]
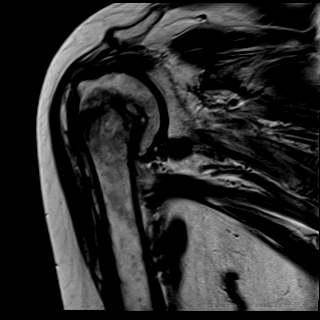
[im 16/26]
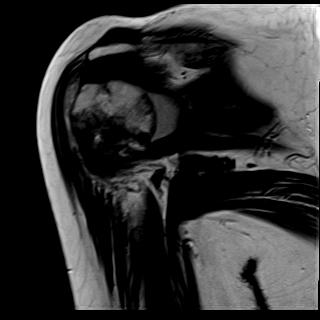
[im 19/26]
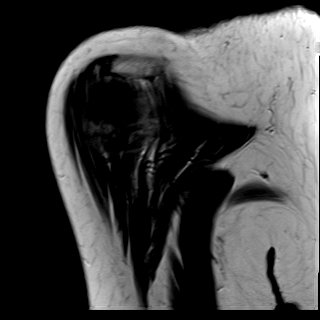
[im 22/26]
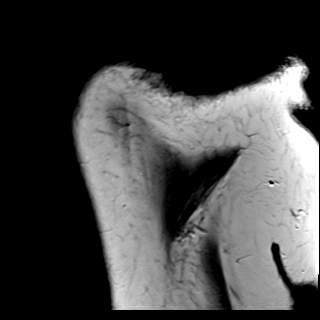
[im 26/26]
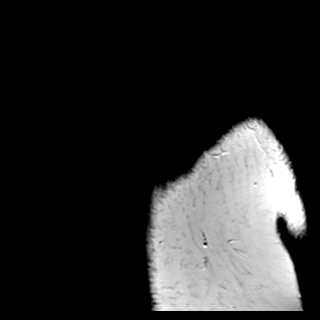

[Series 7: T2 fat-sat · sagittal · right · 4.0mm · 0.44mm/px · 9 of 26 slices shown (2 of 3)]
[im 1/26]
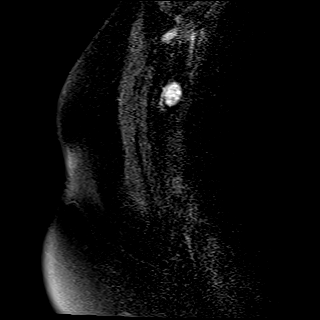
[im 4/26]
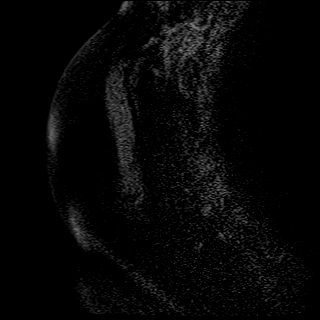
[im 7/26]
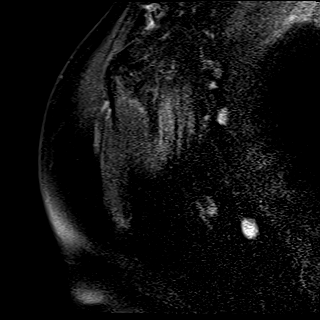
[im 10/26]
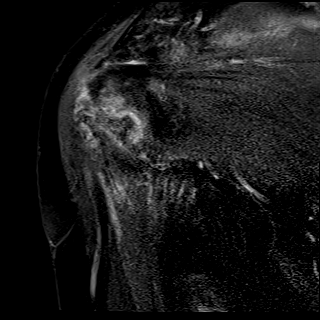
[im 13/26]
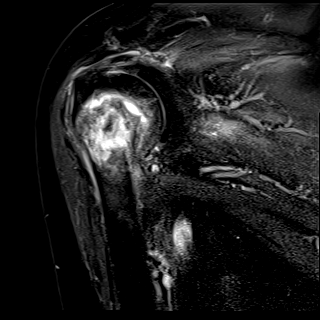
[im 16/26]
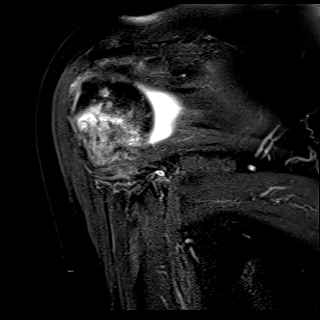
[im 19/26]
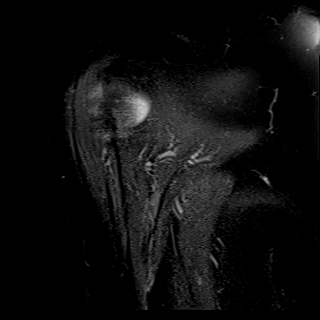
[im 22/26]
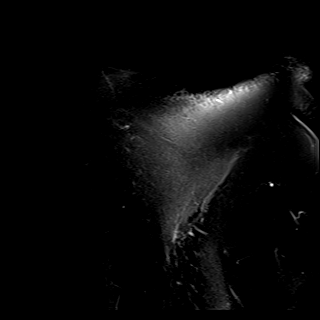
[im 26/26]
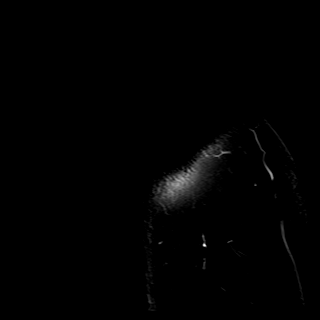

[Series 8: T2 fat-sat · coronal · right · 4.0mm · 0.22mm/px · 5 of 22 slices shown (3 of 3)]
[im 1/22]
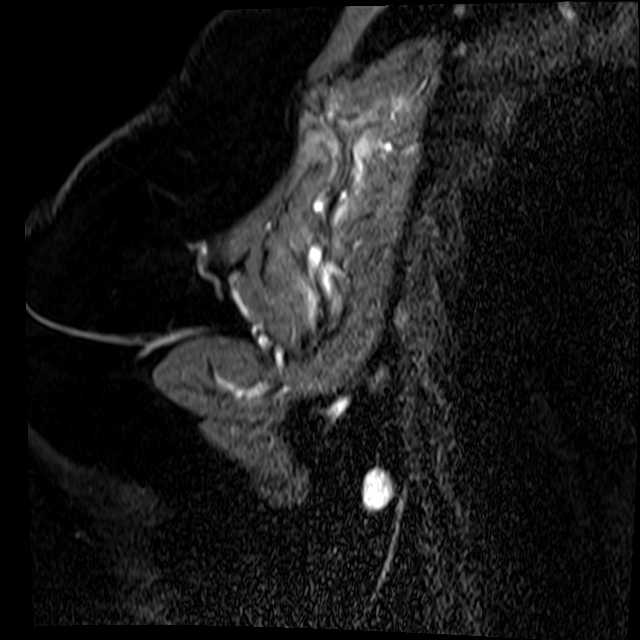
[im 4/22]
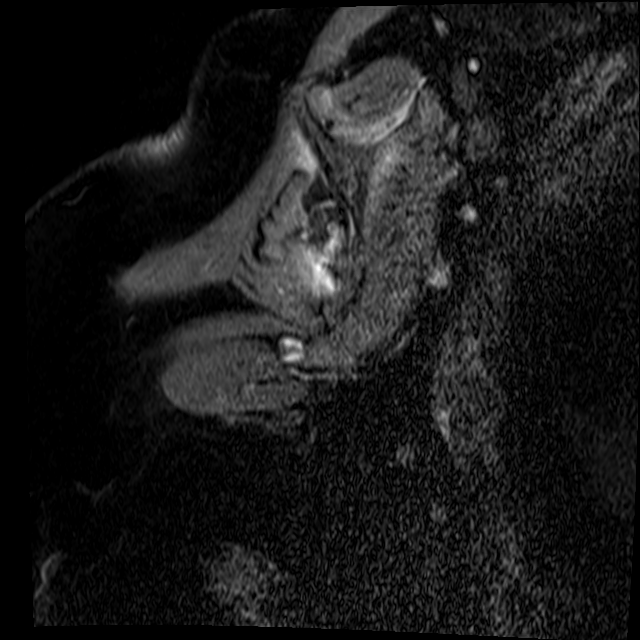
[im 8/22]
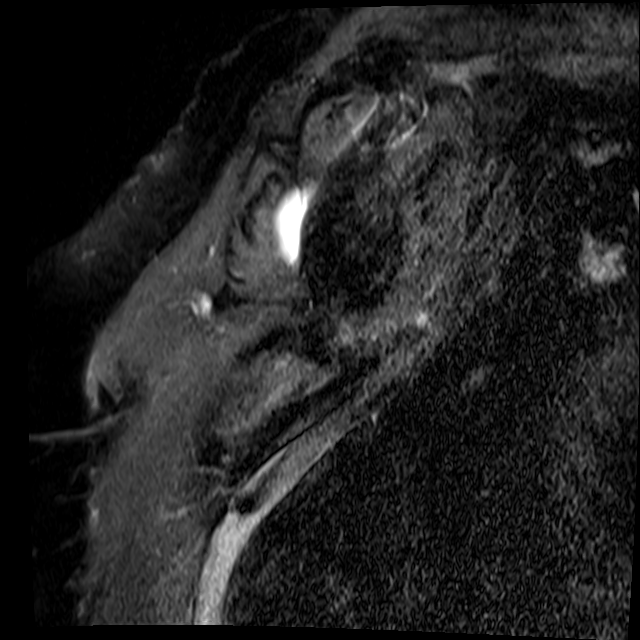
[im 11/22]
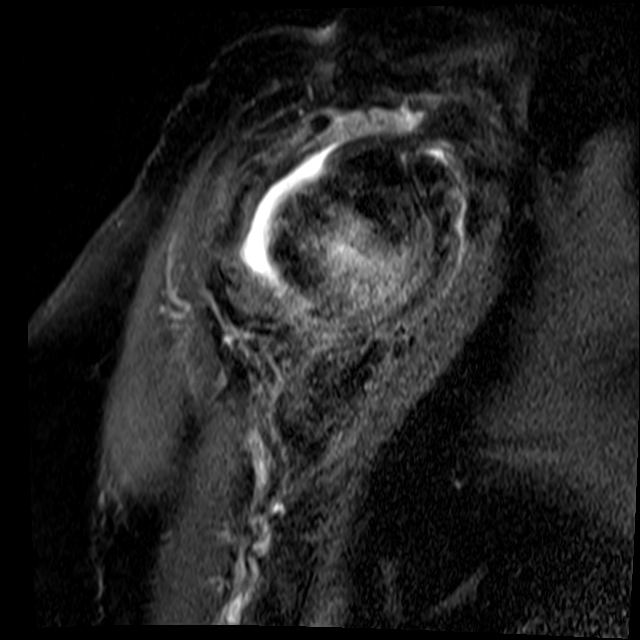
[im 18/22]
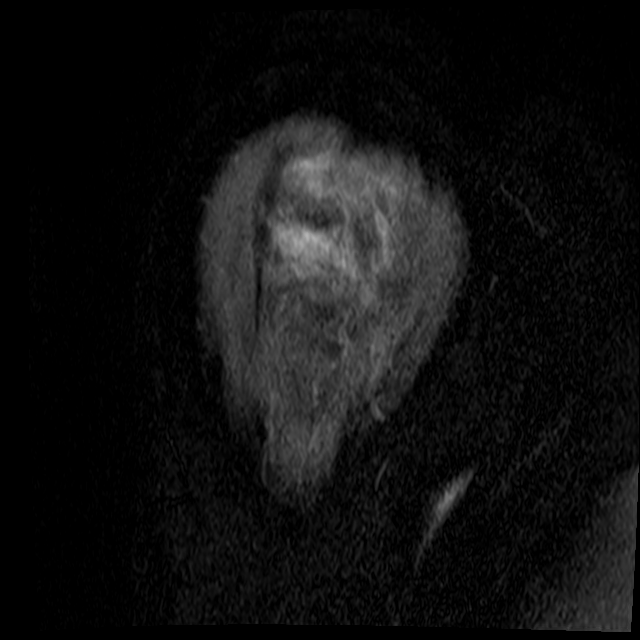

[31 of 40 positions shown; findings below may reference images not displayed]

FINDINGS: Rotator cuff: There is high-grade attenuation of the entire
supraspinatus tendon footprint, with likely a full-thickness tear of
the anterior 1.5 cm of the tendon with up to 1.0 cm tendon
retraction (coronal 14 through 17). Moderate thinning of the
articular side of the anterior infraspinatus tendon insertion.
Moderate tendinosis of the more posterior infraspinatus tendon.
Mild-to-moderate intermediate T2 signal and thickening tendinosis of
the superior subscapularis tendon. The teres minor is intact.

Muscles: Moderate supraspinatus and mild anterior infraspinatus
muscle atrophy. There is also moderate teres minor muscle atrophy.

Biceps long head: The humeral head is externally rotated. The long
head of the biceps tendon appears intact.

Acromioclavicular Joint: There are mild-to-moderate degenerative
changes of the acromioclavicular joint including joint space
narrowing, subchondral marrow edema, and peripheral osteophytosis.
Type II acromion.

Glenohumeral Joint: Moderate thinning of the glenoid and humeral
head cartilage.

Labrum: Grossly intact, but evaluation is limited by lack of
intraarticular fluid.

Bones: There is high-grade marrow edema throughout the humeral head
and surgical neck from the known subacute moderately impacted and
mildly displaced fracture of the humeral head-neck junction. There
is mild approximate 4 mm lateral and 4 mm anterior displacement of
the distal fracture component with respect of the proximal fracture
component. The humeral head remains normally located with respect to
the glenoid fossa.

Other: None.
IMPRESSION: :
IMPRESSION: 1. Known subacute moderately impacted and mildly displaced fracture
of the humeral head-neck junction with high-grade marrow edema.
2. Full-thickness tear of the anterior supraspinatus tendon and
high-grade partial-thickness articular sided tear of the posterior
supraspinatus tendon and anterior infraspinatus tendon.
3. Moderate supraspinatus and mild anterior infraspinatus muscle
atrophy suggests at least a component of these tears is chronic.
4. Mild-to-moderate degenerative changes of the acromioclavicular
joint.
